# Patient Record
Sex: Female | Born: 1990
Health system: Southern US, Community
[De-identification: ages and names within clinical notes are randomized; demographics above are authoritative.]

## PROBLEM LIST (undated history)

## (undated) DIAGNOSIS — N926 Irregular menstruation, unspecified: Secondary | ICD-10-CM

## (undated) DIAGNOSIS — R87629 Unspecified abnormal cytological findings in specimens from vagina: Secondary | ICD-10-CM

## (undated) DIAGNOSIS — Z8742 Personal history of other diseases of the female genital tract: Secondary | ICD-10-CM

## (undated) DIAGNOSIS — I10 Essential (primary) hypertension: Secondary | ICD-10-CM

## (undated) HISTORY — DX: Irregular menstruation, unspecified: N92.6

## (undated) HISTORY — DX: Personal history of other diseases of the female genital tract: Z87.42

## (undated) HISTORY — DX: Unspecified abnormal cytological findings in specimens from vagina: R87.629

## (undated) HISTORY — PX: OTHER SURGICAL HISTORY: SHX169

---

## 2016-03-16 ENCOUNTER — Encounter: Payer: Self-pay | Admitting: Emergency Medicine

## 2016-03-16 ENCOUNTER — Emergency Department
Admission: EM | Admit: 2016-03-16 | Discharge: 2016-03-16 | Disposition: A | Discharge status: Home / Self Care | Payer: PRIVATE HEALTH INSURANCE | Attending: Emergency Medicine | Admitting: Emergency Medicine

## 2016-03-16 DIAGNOSIS — M79671 Pain in right foot: Secondary | ICD-10-CM | POA: Insufficient documentation

## 2016-03-16 NOTE — ED Provider Notes (Signed)
[facility] Emergency Department Provider Note  ____________________________________________   First MD Initiated Contact with Patient 03/16/16 [phone]     (approximate)  I have reviewed the triage vital signs and the nursing notes.   HISTORY  Chief Complaint Foot Pain    HPI [name] is a 25 y.o. female with no significant past medical history who presents for evaluation of gradual onset pain in her right heel over the last 2-3 days.  She reports that she has a new job that requires her to stand on her feet for 10-12 hours at a time.  The pain started after her new job began.  Weight-bearing and walking on the foot make it worse and is makes it a little bit better.  She denies any recent traumatic injuries.  She states that the pain is at the very back part of the right heel and does not radiate throughout the rest of her foot although pushing on other parts of her foot might make her heel hurt.  There are no breaks in the skin.   History reviewed. No pertinent past medical history.  There are no active problems to display for this patient.   History reviewed. No pertinent surgical history.  Prior to Admission medications   Not on File    Allergies Review of patient's allergies indicates no known allergies.  History reviewed. No pertinent family history.  Social History Social History  Substance Use Topics  . Smoking status: Never Smoker  . Smokeless tobacco: Never Used  . Alcohol use Yes     Comment: seldom    Review of Systems Constitutional: No fever/chills Cardiovascular: Denies chest pain. Respiratory: Denies shortness of breath. Gastrointestinal: No abdominal pain.  No nausea, no vomiting.  No diarrhea.  No constipation. Musculoskeletal: Pain in heel of right foot.  No history of trauma Skin: Negative for rash. Neurological: Negative for headaches, focal weakness or  numbness.   ____________________________________________   PHYSICAL EXAM:  VITAL SIGNS: ED Triage Vitals [03/16/16 0453]  Enc Vitals Group     BP (!) 139/97     Pulse Rate 65     Resp 18     Temp 98.9 F (37.2 C)     Temp Source Oral     SpO2 100 %     Weight 200 lb (90.7 kg)     Height 5\' 8"  (1.727 m)     Head Circumference      Peak Flow      Pain Score 7     Pain Loc      Pain Edu?      Excl. in GC?     Constitutional: Alert and oriented. Well appearing and in no acute distress. Head: Atraumatic. Cardiovascular: Normal rate, regular rhythm. Good peripheral circulation.  Respiratory: Normal respiratory effort.  No retractions.  Musculoskeletal: I do not appreciate any difference between the 2 feet and heels on visual inspection.  She has point tenderness to palpation of the posterior aspect of the calcaneus.  The bottom of the foot is not tender to palpation as I might expect with plantar fasciitis.  There is no midfoot tenderness.  There are no breaks in the skin or evidence of infection.  No lower extremity tenderness nor edema. No gross deformities of extremities. Neurologic:  Normal speech and language. No gross focal neurologic deficits are appreciated.  Skin:  Skin is warm, dry and intact. No rash noted. Psychiatric: Mood and affect are normal. Speech and behavior are  normal.  ____________________________________________   LABS (all labs ordered are listed, but only abnormal results are displayed)  Labs Reviewed - No data to display ____________________________________________  EKG  None - EKG not ordered by ED physician ____________________________________________  RADIOLOGY   No results found.  ____________________________________________   PROCEDURES  Procedure(s) performed:   Procedures   Critical Care performed: No ____________________________________________   INITIAL IMPRESSION / ASSESSMENT AND PLAN / ED COURSE  Pertinent labs &  imaging results that were available during my care of the patient were reviewed by me and considered in my medical decision making (see chart for details).  I believe the patient has some bruising to her calcaneus due to her new job requiring her to be on her feet for long periods of time.  There is no indication for x-rays.  I did offer her that if she felt strongly about getting one hour ordered but that absence of any traumatic injury to think that it would be unnecessary testing and she agrees that that is fine.  I will give her a hard soled shoe to see if the cells but I told her that it also may be that she needs a softer shoe with more cushioning.  I encouraged her to follow up with podiatry and see if orthotics might be helpful for her.  She has not taken the over-the-counter medications and I encouraged her to take NSAIDs as well as use cold packs.  She understands and agrees with the plan.   ____________________________________________  FINAL CLINICAL IMPRESSION(S) / ED DIAGNOSES  Final diagnoses:  Pain of right heel     MEDICATIONS GIVEN DURING THIS VISIT:  Medications - No data to display   NEW OUTPATIENT MEDICATIONS STARTED DURING THIS VISIT:  New Prescriptions   No medications on file    Modified Medications   No medications on file    Discontinued Medications   No medications on file     Note:  This document was prepared using Dragon voice recognition software and may include unintentional dictation errors.    Loleta Roseory Destyn Schuyler, MD 03/16/16 [phone]

## 2016-03-16 NOTE — Discharge Instructions (Signed)
Please consider following up with a podiatrist at the next available opportunity.  They may be able to help you with some custom inserts freer shoes or offer additional recommendations to help alleviate the discomfort that comes from standing on her feet for long periods of time.  Try using the shoe that we provided tonight as well as over-the-counter ibuprofen and/or Tylenol (although ibuprofen may be better for both the pain control and anti-inflammatory effect).

## 2016-03-16 NOTE — ED Triage Notes (Signed)
Pt c/o pain and swelling to right achilles; started Friday and worsening; denies injury; pt declined wheelchair upon arrival

## 2016-06-23 ENCOUNTER — Encounter: Payer: Self-pay | Admitting: Emergency Medicine

## 2016-06-23 ENCOUNTER — Emergency Department
Admission: EM | Admit: 2016-06-23 | Discharge: 2016-06-23 | Disposition: A | Discharge status: Home / Self Care | Payer: PRIVATE HEALTH INSURANCE | Attending: Emergency Medicine | Admitting: Emergency Medicine

## 2016-06-23 DIAGNOSIS — Z5321 Procedure and treatment not carried out due to patient leaving prior to being seen by health care provider: Secondary | ICD-10-CM | POA: Insufficient documentation

## 2016-06-23 DIAGNOSIS — N939 Abnormal uterine and vaginal bleeding, unspecified: Secondary | ICD-10-CM | POA: Insufficient documentation

## 2016-06-23 LAB — COMPREHENSIVE METABOLIC PANEL
ALT: 14 U/L (ref 14–54)
AST: 13 U/L — ABNORMAL LOW (ref 15–41)
Albumin: 4 g/dL (ref 3.5–5.0)
Alkaline Phosphatase: 32 U/L — ABNORMAL LOW (ref 38–126)
Anion gap: 4 — ABNORMAL LOW (ref 5–15)
BILIRUBIN TOTAL: 0.7 mg/dL (ref 0.3–1.2)
BUN: 9 mg/dL (ref 6–20)
CHLORIDE: 109 mmol/L (ref 101–111)
CO2: 25 mmol/L (ref 22–32)
Calcium: 9.1 mg/dL (ref 8.9–10.3)
Creatinine, Ser: 0.78 mg/dL (ref 0.44–1.00)
Glucose, Bld: 83 mg/dL (ref 65–99)
Potassium: 3.8 mmol/L (ref 3.5–5.1)
Sodium: 138 mmol/L (ref 135–145)
TOTAL PROTEIN: 6.8 g/dL (ref 6.5–8.1)

## 2016-06-23 LAB — CBC
HEMATOCRIT: 39.6 % (ref 35.0–47.0)
Hemoglobin: 13.3 g/dL (ref 12.0–16.0)
MCH: 31.1 pg (ref 26.0–34.0)
MCHC: 33.6 g/dL (ref 32.0–36.0)
MCV: 92.6 fL (ref 80.0–100.0)
Platelets: 284 10*3/uL (ref 150–440)
RBC: 4.27 MIL/uL (ref 3.80–5.20)
RDW: 14.2 % (ref 11.5–14.5)
WBC: 8.5 10*3/uL (ref 3.6–11.0)

## 2016-06-23 NOTE — ED Triage Notes (Signed)
Pt presents with vaginal bleeding and abd cramping for over a mth now. Pt is new to the are and does  Not have an GYN yet.

## 2016-10-14 ENCOUNTER — Encounter: Payer: Self-pay | Admitting: Emergency Medicine

## 2016-10-14 ENCOUNTER — Emergency Department
Admission: EM | Admit: 2016-10-14 | Discharge: 2016-10-14 | Disposition: A | Discharge status: Home / Self Care | Payer: PRIVATE HEALTH INSURANCE | Attending: Emergency Medicine | Admitting: Emergency Medicine

## 2016-10-14 DIAGNOSIS — L03011 Cellulitis of right finger: Secondary | ICD-10-CM | POA: Insufficient documentation

## 2016-10-14 MED ORDER — SULFAMETHOXAZOLE-TRIMETHOPRIM 800-160 MG PO TABS: 1.0000 | ORAL_TABLET | Freq: Two times a day (BID) | ORAL | 0 refills | Status: DC

## 2016-10-14 MED ORDER — LIDOCAINE HCL (PF) 1 % IJ SOLN
INTRAMUSCULAR | Status: AC
  Filled 2016-10-14: qty 5

## 2016-10-14 MED ORDER — TRAMADOL HCL 50 MG PO TABS: 50.0000 mg | ORAL_TABLET | Freq: Four times a day (QID) | ORAL | 0 refills | Status: DC | PRN

## 2016-10-14 NOTE — Discharge Instructions (Signed)
Advised [place] for 5 minutes twice a day for 3 days.

## 2016-10-14 NOTE — ED Provider Notes (Signed)
[facility] Emergency Department Provider Note   ____________________________________________   First MD Initiated Contact with Patient 10/14/16 1103     (approximate)  I have reviewed the triage vital signs and the nursing notes.   HISTORY  Chief Complaint Hand Pain    HPI [name] is a 26 y.o. female patient complaining of pain and edema third digit right hand. Patient state onset 2-3 days ago. Patient believes onset with the father her nails. Patient rates pain as 7/10. Patient had a pain as "achy". No palliative measures for this complaint.  History reviewed. No pertinent past medical history.  There are no active problems to display for this patient.   History reviewed. No pertinent surgical history.  Prior to Admission medications   Medication Sig Start Date End Date Taking? Authorizing Provider  sulfamethoxazole-trimethoprim (BACTRIM DS,SEPTRA DS) 800-160 MG tablet Take 1 tablet by mouth 2 (two) times daily. 10/14/16   Joni Reining, PA-C  traMADol (ULTRAM) 50 MG tablet Take 1 tablet (50 mg total) by mouth every 6 (six) hours as needed for moderate pain. 10/14/16   Joni Reining, PA-C    Allergies Patient has no known allergies.  No family history on file.  Social History Social History  Substance Use Topics  . Smoking status: Never Smoker  . Smokeless tobacco: Never Used  . Alcohol use Yes     Comment: seldom    Review of Systems  Constitutional: No fever/chills Eyes: No visual changes. ENT: No sore throat. Cardiovascular: Denies chest pain. Respiratory: Denies shortness of breath. Gastrointestinal: No abdominal pain.  No nausea, no vomiting.  No diarrhea.  No constipation. Genitourinary: Negative for dysuria. Musculoskeletal: Negative for back pain. Skin: Negative for rash Pain and edema right third finger Neurological: Negative for headaches, focal weakness or  numbness.   ____________________________________________   PHYSICAL EXAM:  VITAL SIGNS: ED Triage Vitals  Enc Vitals Group     BP 10/14/16 1035 134/90     Pulse Rate 10/14/16 1035 68     Resp 10/14/16 1035 18     Temp 10/14/16 1035 99.4 F (37.4 C)     Temp Source 10/14/16 1035 Oral     SpO2 10/14/16 1035 100 %     Weight 10/14/16 1035 200 lb (90.7 kg)     Height 10/14/16 1035 5\' 8"  (1.727 m)     Head Circumference --      Peak Flow --      Pain Score 10/14/16 0929 7     Pain Loc --      Pain Edu? --      Excl. in GC? --     Constitutional: Alert and oriented. Well appearing and in no acute distress. Eyes: Conjunctivae are normal. PERRL. EOMI. Head: Atraumatic. Nose: No congestion/rhinnorhea. Mouth/Throat: Mucous membranes are moist.  Oropharynx non-erythematous. Neck: No stridor.  No cervical spine tenderness to palpation. Hematological/Lymphatic/Immunilogical: No cervical lymphadenopathy. Cardiovascular: Normal rate, regular rhythm. Grossly normal heart sounds.  Good peripheral circulation. Respiratory: Normal respiratory effort.  No retractions. Lungs CTAB. Gastrointestinal: Soft and nontender. No distention. No abdominal bruits. No CVA tenderness. Musculoskeletal: No lower extremity tenderness nor edema.  No joint effusions. Neurologic:  Normal speech and language. No gross focal neurologic deficits are appreciated. No gait instability. Skin:  Skin is warm, dry and intact. No rash noted. Edema and erythema medial aspect third digit right hand Psychiatric: Mood and affect are normal. Speech and behavior are normal.  ____________________________________________   LABS (  all labs ordered are listed, but only abnormal results are displayed)  Labs Reviewed - No data to display ____________________________________________  EKG   ____________________________________________  RADIOLOGY   ____________________________________________   PROCEDURES  Procedure(s)  performed: INCISION AND DRAINAGE Performed by: Joni Reiningonald K Smith Consent: Verbal consent obtained. Risks and benefits: risks, benefits and alternatives were discussed Type: abscess  Body area: Third digit right hand  Anesthesia: Digital block  Incision was made with a scalpel.  Local anesthetic: lidocaine 1% without epinephrine  Anesthetic total: 4 ml  Complexity: Simple Blunt dissection to break up loculations  Drainage: purulent  Drainage amount: Small   Patient tolerance: Patient tolerated the procedure well with no immediate complications.     Procedures  Critical Care performed: No  ____________________________________________   INITIAL IMPRESSION / ASSESSMENT AND PLAN / ED COURSE  Pertinent labs & imaging results that were available during my care of the patient were reviewed by me and considered in my medical decision making (see chart for details).  Patient presented with nailbed edema to the third digit right hand. Patient given discharge care instructions. Patient given a prescription for Bactrim and tramadol. Patient advised follow-up with family doctor condition persists.      ____________________________________________   FINAL CLINICAL IMPRESSION(S) / ED DIAGNOSES  Final diagnoses:  Paronychia of finger of right hand      NEW MEDICATIONS STARTED DURING THIS VISIT:  New Prescriptions   SULFAMETHOXAZOLE-TRIMETHOPRIM (BACTRIM DS,SEPTRA DS) 800-160 MG TABLET    Take 1 tablet by mouth 2 (two) times daily.   TRAMADOL (ULTRAM) 50 MG TABLET    Take 1 tablet (50 mg total) by mouth every 6 (six) hours as needed for moderate pain.     Note:  This document was prepared using Dragon voice recognition software and may include unintentional dictation errors.    Joni ReiningSmith, Ronald K, PA-C 10/14/16 1137    Emily FilbertWilliams, Jonathan E, MD 10/14/16 [phone]

## 2016-10-14 NOTE — ED Triage Notes (Signed)
Right 4th finger swelling around nai,.

## 2017-04-06 ENCOUNTER — Ambulatory Visit (INDEPENDENT_AMBULATORY_CARE_PROVIDER_SITE_OTHER): Payer: BLUE CROSS/BLUE SHIELD | Admitting: Certified Nurse Midwife

## 2017-04-06 ENCOUNTER — Encounter: Payer: Self-pay | Admitting: Certified Nurse Midwife

## 2017-04-06 VITALS — BP 135/103 | HR 77 | Ht 68.0 in | Wt 205.5 lb

## 2017-04-06 DIAGNOSIS — Z124 Encounter for screening for malignant neoplasm of cervix: Secondary | ICD-10-CM | POA: Diagnosis not present

## 2017-04-06 DIAGNOSIS — Z113 Encounter for screening for infections with a predominantly sexual mode of transmission: Secondary | ICD-10-CM | POA: Diagnosis not present

## 2017-04-06 NOTE — Patient Instructions (Signed)
Preventive Care 18-39 Years, Female Preventive care refers to lifestyle choices and visits with your health care provider that can promote health and wellness. What does preventive care include?  A yearly physical exam. This is also called an annual well check.  Dental exams once or twice a year.  Routine eye exams. Ask your health care provider how often you should have your eyes checked.  Personal lifestyle choices, including: ? Daily care of your teeth and gums. ? Regular physical activity. ? Eating a healthy diet. ? Avoiding tobacco and drug use. ? Limiting alcohol use. ? Practicing safe sex. ? Taking vitamin and mineral supplements as recommended by your health care provider. What happens during an annual well check? The services and screenings done by your health care provider during your annual well check will depend on your age, overall health, lifestyle risk factors, and family history of disease. Counseling Your health care provider may ask you questions about your:  Alcohol use.  Tobacco use.  Drug use.  Emotional well-being.  Home and relationship well-being.  Sexual activity.  Eating habits.  Work and work Statistician.  Method of birth control.  Menstrual cycle.  Pregnancy history.  Screening You may have the following tests or measurements:  Height, weight, and BMI.  Diabetes screening. This is done by checking your blood sugar (glucose) after you have not eaten for a while (fasting).  Blood pressure.  Lipid and cholesterol levels. These may be checked every 5 years starting at age 66.  Skin check.  Hepatitis C blood test.  Hepatitis B blood test.  Sexually transmitted disease (STD) testing.  BRCA-related cancer screening. This may be done if you have a family history of breast, ovarian, tubal, or peritoneal cancers.  Pelvic exam and Pap test. This may be done every 3 years starting at age 40. Starting at age 59, this may be done every 5  years if you have a Pap test in combination with an HPV test.  Discuss your test results, treatment options, and if necessary, the need for more tests with your health care provider. Vaccines Your health care provider may recommend certain vaccines, such as:  Influenza vaccine. This is recommended every year.  Tetanus, diphtheria, and acellular pertussis (Tdap, Td) vaccine. You may need a Td booster every 10 years.  Varicella vaccine. You may need this if you have not been vaccinated.  HPV vaccine. If you are 69 or younger, you may need three doses over 6 months.  Measles, mumps, and rubella (MMR) vaccine. You may need at least one dose of MMR. You may also need a second dose.  Pneumococcal 13-valent conjugate (PCV13) vaccine. You may need this if you have certain conditions and were not previously vaccinated.  Pneumococcal polysaccharide (PPSV23) vaccine. You may need one or two doses if you smoke cigarettes or if you have certain conditions.  Meningococcal vaccine. One dose is recommended if you are age 27-21 years and a first-year college student living in a residence hall, or if you have one of several medical conditions. You may also need additional booster doses.  Hepatitis A vaccine. You may need this if you have certain conditions or if you travel or work in places where you may be exposed to hepatitis A.  Hepatitis B vaccine. You may need this if you have certain conditions or if you travel or work in places where you may be exposed to hepatitis B.  Haemophilus influenzae type b (Hib) vaccine. You may need this if  you have certain risk factors.  Talk to your health care provider about which screenings and vaccines you need and how often you need them. This information is not intended to replace advice given to you by your health care provider. Make sure you discuss any questions you have with your health care provider. Document Released: 07/21/2001 Document Revised: 02/12/2016  Document Reviewed: 03/26/2015 Elsevier Interactive Patient Education  2017 [employer].

## 2017-04-06 NOTE — Progress Notes (Signed)
GYNECOLOGY ANNUAL PREVENTATIVE CARE ENCOUNTER NOTE  Subjective:   [name] is a 26 y.o. G0P0000 female here for a routine annual gynecologic exam.  Current complaints: none.   Denies abnormal vaginal bleeding, discharge, pelvic pain, problems with intercourse or other gynecologic concerns. She uses condoms but not regularly and would like STD testing today. BP elevated today. Pt states that is abnormal for her BP usually fine . She denies history of hypertension and thinks it is because she is nervous about the exam.    Gynecologic History Patient's last menstrual period was 03/17/2017 (approximate). Contraception: condoms, not every time Last Pap: over a year ago. Results were: abnormal Last mammogram: N/A.   Obstetric History OB History  Gravida Para Term Preterm AB Living  0 0 0 0 0 0  SAB TAB Ectopic Multiple Live Births  0 0 0 0 0        Past Medical History:  Diagnosis Date  . Hx of abnormal cervical Pap smear   . Irregular periods    history    Past Surgical History:  Procedure Laterality Date  . colposcopy      No current outpatient prescriptions on file prior to visit.   No current facility-administered medications on file prior to visit.     No Known Allergies  Social History   Social History  . Marital status: Single    Spouse name: N/A  . Number of children: N/A  . Years of education: N/A   Occupational History  . Not on file.   Social History Main Topics  . Smoking status: Former Games developermoker  . Smokeless tobacco: Never Used  . Alcohol use Yes     Comment: seldom  . Drug use: No  . Sexual activity: Yes    Partners: Male    Birth control/ protection: Condom   Other Topics Concern  . Not on file   Social History Narrative  . No narrative on file    Family History  Problem Relation Age of Onset  . Hypertension Mother   . Stroke Mother        mini stroke?  Marland Kitchen. Thyroid disease Mother        thyroid removed  . Hypertension Sister      The following portions of the patient's history were reviewed and updated as appropriate: allergies, current medications, past family history, past medical history, past social history, past surgical history and problem list.  Review of Systems Constitutional: negative Eyes: negative Ears, nose, mouth, throat, and face: negative Respiratory: negative Cardiovascular: negative Gastrointestinal: negative Genitourinary:positive for abnormal odor Integument/breast: negative Hematologic/lymphatic: negative Musculoskeletal:negative Neurological: negative Behavioral/Psych: negative Endocrine: negative Allergic/Immunologic: negative   Objective:  BP (!) 135/103 (BP Location: Right Arm, Patient Position: Sitting, Cuff Size: Large)   Pulse 77   Ht 5\' 8"  (1.727 m)   Wt 205 lb 8 oz (93.2 kg)   LMP 03/17/2017 (Approximate) Comment: hx irregular periods  BMI 31.25 kg/m  CONSTITUTIONAL: Well-developed, well-nourished, overweight female in no acute distress.  HENT:  Normocephalic, atraumatic,. Oropharynx is clear and moist EYES: Conjunctivae and EOM are normal. No scleral icterus.  NECK: Normal range of motion, supple, no masses.  Normal thyroid.  SKIN: Skin is warm and dry. No rash noted. Not diaphoretic. No erythema. No pallor. NEUROLOGIC: Alert and oriented to person, place, and time. Normal reflexes, muscle tone coordination. No cranial nerve deficit noted. PSYCHIATRIC: Normal mood and affect. Normal behavior. Normal judgment and thought content. CARDIOVASCULAR: Normal heart rate noted,  regular rhythm RESPIRATORY: Clear to auscultation bilaterally. Effort and breath sounds normal, no problems with respiration noted. BREASTS: Symmetric in size. No masses, skin changes, nipple drainage, or lymphadenopathy. Fibrocystic breast bilaterally  ABDOMEN: Soft, normal bowel sounds, no distention noted.  No tenderness, rebound or guarding.  PELVIC: Normal appearing external genitalia; normal  appearing vaginal mucosa and cervix.  No abnormal discharge noted.  Pap smear obtained.  Normal uterine size, no other palpable masses, no uterine or adnexal tenderness. MUSCULOSKELETAL: Normal range of motion. No tenderness.  No cyanosis, clubbing, or edema.  2+ distal pulses.   Assessment and Plan:  1. Screening for cervical cancer - Pap IG w/ reflex to HPV when [facility] STD testing: Nuswab & blood work   Will follow up results of pap smear and manage accordingly. Primary care referral- for follow up of blood pressure. Pt is unsure when she had her last blood work. Release of medical records signed.  Contraceptive counseling and safe sex practices emphasized. PT states that she wants to get pregnant. Reviewed Menstrual cycle and ovulation  Routine preventative health maintenance measures emphasized. Please refer to After Visit Summary for other counseling recommendations.   Doreene Burke, CNM

## 2017-04-07 LAB — HIV ANTIBODY (ROUTINE TESTING W REFLEX): HIV Screen 4th Generation wRfx: NONREACTIVE

## 2017-04-07 LAB — RPR: RPR: NONREACTIVE

## 2017-04-07 LAB — HSV(HERPES SIMPLEX VRS) I + II AB-IGG
HSV 1 GLYCOPROTEIN G AB, IGG: 2.2 {index} — AB (ref 0.00–0.90)
HSV 2 IGG, TYPE SPEC: 6.76 {index} — AB (ref 0.00–0.90)

## 2017-04-07 LAB — HEPATITIS B SURFACE ANTIGEN: Hepatitis B Surface Ag: NEGATIVE

## 2017-04-08 LAB — PAP IG W/ RFLX HPV ASCU: PAP Smear Comment: 0

## 2017-04-09 ENCOUNTER — Other Ambulatory Visit: Payer: Self-pay | Admitting: Certified Nurse Midwife

## 2017-04-09 LAB — NUSWAB VAGINITIS PLUS (VG+)
ATOPOBIUM VAGINAE: HIGH {score} — AB
BVAB 2: HIGH Score — AB
CANDIDA ALBICANS, NAA: NEGATIVE
CANDIDA GLABRATA, NAA: NEGATIVE
Chlamydia trachomatis, NAA: NEGATIVE
MEGASPHAERA 1: HIGH {score} — AB
NEISSERIA GONORRHOEAE, NAA: NEGATIVE
Trich vag by NAA: NEGATIVE

## 2017-04-09 MED ORDER — METRONIDAZOLE 500 MG PO TABS: 500.0000 mg | ORAL_TABLET | Freq: Two times a day (BID) | ORAL | 0 refills | Status: AC

## 2017-04-09 NOTE — Progress Notes (Signed)
Pt called and discussed lab results. Nuswab positive for BV . Pap smear negative. Pt verbalizes understanding and answered all questions.   Doreene BurkeAnnie Shelbi Vaccaro, CNM

## 2017-07-05 ENCOUNTER — Encounter: Payer: Self-pay | Admitting: Certified Nurse Midwife

## 2017-07-05 ENCOUNTER — Encounter: Payer: Self-pay | Admitting: Nurse Practitioner

## 2017-07-05 ENCOUNTER — Ambulatory Visit (INDEPENDENT_AMBULATORY_CARE_PROVIDER_SITE_OTHER): Payer: BLUE CROSS/BLUE SHIELD | Admitting: Certified Nurse Midwife

## 2017-07-05 ENCOUNTER — Ambulatory Visit (INDEPENDENT_AMBULATORY_CARE_PROVIDER_SITE_OTHER): Payer: BLUE CROSS/BLUE SHIELD | Admitting: Nurse Practitioner

## 2017-07-05 ENCOUNTER — Other Ambulatory Visit: Payer: Self-pay

## 2017-07-05 ENCOUNTER — Ambulatory Visit (INDEPENDENT_AMBULATORY_CARE_PROVIDER_SITE_OTHER): Payer: BLUE CROSS/BLUE SHIELD

## 2017-07-05 VITALS — BP 130/90 | HR 81 | Temp 99.1°F | Ht 68.0 in | Wt 207.4 lb

## 2017-07-05 VITALS — BP 147/100 | HR 70 | Ht 68.0 in | Wt 208.4 lb

## 2017-07-05 DIAGNOSIS — N926 Irregular menstruation, unspecified: Secondary | ICD-10-CM | POA: Diagnosis not present

## 2017-07-05 DIAGNOSIS — Z87898 Personal history of other specified conditions: Secondary | ICD-10-CM

## 2017-07-05 DIAGNOSIS — Z8742 Personal history of other diseases of the female genital tract: Secondary | ICD-10-CM

## 2017-07-05 DIAGNOSIS — Z7689 Persons encountering health services in other specified circumstances: Secondary | ICD-10-CM | POA: Diagnosis not present

## 2017-07-05 DIAGNOSIS — I10 Essential (primary) hypertension: Secondary | ICD-10-CM

## 2017-07-05 MED ORDER — METRONIDAZOLE 500 MG PO TABS: 500.0000 mg | ORAL_TABLET | Freq: Two times a day (BID) | ORAL | 0 refills | Status: AC

## 2017-07-05 NOTE — Patient Instructions (Signed)
Polycystic Ovarian Syndrome °Polycystic ovarian syndrome (PCOS) is a common hormonal disorder among women of reproductive age. In most women with PCOS, many small fluid-filled sacs (cysts) grow on the ovaries, and the cysts are not part of a normal menstrual cycle. PCOS can cause problems with your menstrual periods and make it difficult to get pregnant. It can also cause an increased risk of miscarriage with pregnancy. If it is not treated, PCOS can lead to serious health problems, such as diabetes and heart disease. °What are the causes? °The cause of PCOS is not known, but it may be the result of a combination of certain factors, such as: °· Irregular menstrual cycle. °· High levels of certain hormones (androgens). °· Problems with the hormone that helps to control blood sugar (insulin resistance). °· Certain genes. ° °What increases the risk? °This condition is more likely to develop in women who have a family history of PCOS. °What are the signs or symptoms? °Symptoms of PCOS may include: °· Multiple ovarian cysts. °· Infrequent periods or no periods. °· Periods that are too frequent or too heavy. °· Unpredictable periods. °· Inability to get pregnant (infertility) because of not ovulating. °· Increased growth of hair on the face, chest, stomach, back, thumbs, thighs, or toes. °· Acne or oily skin. Acne may develop during adulthood, and it may not respond to treatment. °· Pelvic pain. °· Weight gain or obesity. °· Patches of thickened and dark brown or black skin on the neck, arms, breasts, or thighs (acanthosis nigricans). °· Excess hair growth on the face, chest, abdomen, or upper thighs (hirsutism). ° °How is this diagnosed? °This condition is diagnosed based on: °· Your medical history. °· A physical exam, including a pelvic exam. Your health care provider may look for areas of increased hair growth on your skin. °· Tests, such as: °? Ultrasound. This may be used to examine the ovaries and the lining of the  uterus (endometrium) for cysts. °? Blood tests. These may be used to check levels of sugar (glucose), female hormone (testosterone), and female hormones (estrogen and progesterone) in your blood. ° °How is this treated? °There is no cure for PCOS, but treatment can help to manage symptoms and prevent more health problems from developing. Treatment varies depending on: °· Your symptoms. °· Whether you want to have a baby or whether you need birth control (contraception). ° °Treatment may include nutrition and lifestyle changes along with: °· Progesterone hormone to start a menstrual period. °· Birth control pills to help you have regular menstrual periods. °· Medicines to make you ovulate, if you want to get pregnant. °· Medicine to reduce excessive hair growth. °· Surgery, in severe cases. This may involve making small holes in one or both of your ovaries. This decreases the amount of testosterone that your body produces. ° °Follow these instructions at home: °· Take over-the-counter and prescription medicines only as told by your health care provider. °· Follow a healthy meal plan. This can help you reduce the effects of PCOS. °? Eat a healthy diet that includes lean proteins, complex carbohydrates, fresh fruits and vegetables, low-fat dairy products, and healthy fats. Make sure to eat enough fiber. °· If you are overweight, lose weight as told by your health care provider. °? Losing 10% of your body weight may improve symptoms. °? Your health care provider can determine how much weight loss is best for you and can help you lose weight safely. °· Keep all follow-up visits as told by   your health care provider. This is important. °Contact a health care provider if: °· Your symptoms do not get better with medicine. °· You develop new symptoms. °This information is not intended to replace advice given to you by your health care provider. Make sure you discuss any questions you have with your health care  provider. °Document Released: 09/18/2004 Document Revised: 01/21/2016 Document Reviewed: 11/10/2015 °Elsevier Interactive Patient Education © 2018 Elsevier Inc. ° °

## 2017-07-05 NOTE — Progress Notes (Signed)
Subjective:    Patient ID: [name], female    DOB: 1990/07/14, 27 y.o.   MRN: [id number]  [name] is a 27 y.o. female presenting on 07/05/2017 for Establish Care (elevated bp )   HPI Establish Care New Provider Pt last seen by PCP many years ago (pediatrics), but has had regular care at [facility].  Obtain records from [facility].    Hypertension Elevated BP at [facility] today and at visits prior.  Was encouraged to seek care at a primary care office.  She [facility] with [facility] total. Family history of elevated BP in mother and sister.     - Pt denies headache, lightheadedness, dizziness, changes in vision, chest tightness/pressure, palpitations, leg swelling, sudden loss of speech or loss of consciousness. - She  reports no regular exercise routine and has recently moved into a more sedentary job.  She has since noted some weight gain - Her diet is high in salt, high in fat, and moderate to high in carbohydrates. Mon-Th: she has nabs (PB crackers), coffee for breakfast and Buffaloed blue salad for lunch.  No dinner, no snacks. Friday: [place] bacon, egg/cheese biscuit with fries, sweet tea or OJ once a week.  Buffaloed blue salad for lunch.  No dinner, no snacks. Saturday: meal out at [employer] chicken, creamy/artichoke/mushroom sauce, mashed potatoes, salad bar - Ate entire meal Sunday: meal cooked at home with baked chicken, vegetable side, bread  Anxiety Pt also admits she occasionally has chest tightness with sweaty palms. Symptoms last minutes and are related to stress in her new job.  These things did not occur prior to her new job.  Pt notes feeling a bit more anxious and nervous than in past, but notes she loves her new job.  Depression screen PHQ 2/9 07/05/2017  Decreased Interest 0  Down, Depressed, Hopeless 0  PHQ - 2 Score 0     Past Medical History:  Diagnosis Date  . Hx of abnormal cervical Pap smear    Pt with several  normal paps since abnormal pap  . Irregular periods    history   Past Surgical History:  Procedure Laterality Date  . colposcopy     Social History   Socioeconomic History  . Marital status: Single    Spouse name: Not on file  . Number of children: Not on file  . Years of education: Not on file  . Highest education level: Not on file  Social Needs  . Financial resource strain: Not on file  . Food insecurity - worry: Not on file  . Food insecurity - inability: Not on file  . Transportation needs - medical: Not on file  . Transportation needs - non-medical: Not on file  Occupational History  . Not on file  Tobacco Use  . Smoking status: Former Smoker    Packs/day: 0.50    Years: 7.00    Pack years: 3.50    Last attempt to quit: 07/05/2014    Years since quitting: 3.0  . Smokeless tobacco: Never Used  Substance and Sexual Activity  . Alcohol use: Yes    Comment: seldom  . Drug use: No  . Sexual activity: Yes    Partners: Male    Birth control/protection: None  Other Topics Concern  . Not on file  Social History Narrative  . Not on file   Family History  Problem Relation Age of Onset  . Hypertension Mother   . Stroke Mother  mini stroke?  Marland Kitchen Thyroid disease Mother        thyroid removed  . Hypertension Sister    No current outpatient medications on file prior to visit.   No current facility-administered medications on file prior to visit.     Review of Systems  Constitutional: Negative.   HENT: Negative.   Eyes: Negative.   Respiratory: Positive for chest tightness.   Cardiovascular: Negative.   Gastrointestinal: Negative.   Endocrine: Negative.   Genitourinary: Negative.   Musculoskeletal: Negative.   Skin: Negative.   Allergic/Immunologic: Negative.   Neurological: Negative.   Hematological: Negative.   Psychiatric/Behavioral: The patient is nervous/anxious.    Per HPI unless specifically indicated above     Objective:    BP (!) 146/97  (BP Location: Right Arm, Patient Position: Sitting, Cuff Size: Normal)   Pulse 81   Temp 99.1 F (37.3 C) (Oral)   Ht 5\' 8"  (1.727 m)   Wt 207 lb 6.4 oz (94.1 kg)   BMI 31.54 kg/m    BP Recheck 130/90  Wt Readings from Last 3 Encounters:  07/05/17 207 lb 6.4 oz (94.1 kg)  07/05/17 208 lb 6 oz (94.5 kg)  04/06/17 205 lb 8 oz (93.2 kg)    Physical Exam  General - overweight, well-appearing, NAD HEENT - Normocephalic, atraumatic Neck - supple, non-tender, no LAD, no thyromegaly Heart - RRR, no murmurs heard Lungs - Clear throughout all lobes, no wheezing, crackles, or rhonchi. Normal work of breathing. Extremeties - non-tender, no edema, cap refill < 2 seconds, peripheral pulses intact +2 bilaterally Skin - warm, dry Neuro - awake, alert, oriented x3, normal gait Psych - Normal mood, mildly anxious affect, normal behavior   Results for orders placed or performed in visit on 07/05/17  FSH/LH  Result Value Ref Range   LH 27.7 mIU/mL   FSH 7.8 mIU/mL  Lipid panel  Result Value Ref Range   Cholesterol, Total 163 100 - 199 mg/dL   Triglycerides 61 0 - 149 mg/dL   HDL 56 >16 mg/dL   VLDL Cholesterol Cal 12 5 - 40 mg/dL   LDL Calculated 95 0 - 99 mg/dL   Chol/HDL Ratio 2.9 0.0 - 4.4 ratio  Prolactin  Result Value Ref Range   Prolactin 9.6 4.8 - 23.3 ng/mL  Testosterone  Result Value Ref Range   Testosterone 36 8 - 48 ng/dL  TSH  Result Value Ref Range   TSH 1.700 0.450 - 4.500 uIU/mL  Hemoglobin A1C  Result Value Ref Range   Hgb A1c MFr Bld 5.2 4.8 - 5.6 %   Est. average glucose Bld gHb Est-mCnc 103 mg/dL      Assessment & Plan:   Problem List Items Addressed This Visit      Cardiovascular and Mediastinum   Essential hypertension - Primary Currently uncontrolled hypertension, but with recheck at BP at goal of < 130/80.  Pt is ready to start working on lifestyle modifications.  Taking medications tolerating well without side effects. No currently identified  complications except mild anxiety (situational).  Plan: 1. Delay starting medications until lifestyle modifications are attempted.  Discussed goals and treatment options with patient.  Encouraged stress management. 2. Consider labs at next appointment.  3. Encouraged heart healthy diet and increasing exercise to 30 minutes most days of the week. 4. Check BP 1-2 x per week at home, keep log, and bring to clinic at next appointment. 5. Follow up 6 weeks      Other  Other Visit Diagnoses    Encounter to establish care     Previous care received through OBGYN.  NO recent PCP.  [facility]. Past medical, family, and surgical history reviewed w/ pt.       Follow up plan: Return in about 6 weeks (around 08/16/2017) for blood pressure.  Wilhelmina McardleLauren Ilyse Tremain, DNP, AGPCNP-BC Adult Gerontology Primary Care Nurse Practitioner [facility] 08/01/2017, 3:47 PM

## 2017-07-05 NOTE — Patient Instructions (Addendum)
[name], Thank you for coming in to clinic today.  1. Check BP outside of the clinic 1x per week.    Goal BP < 130/80.  2. Reduce sodium intake to 2,300 mg per day.  START exercising for 30 minutes 2 days a week for 2 weeks.  Then, increase to 3 days per week for 2 weeks. Then, 4 days per week and continue.   Please schedule a follow-up appointment with [name], AGNP. Return in about 6 weeks (around 08/16/2017) for blood pressure.  If you have any other questions or concerns, please feel free to call the clinic or send a message through MyChart. You may also schedule an earlier appointment if necessary.  You will receive a survey after today's visit either digitally by e-mail or paper by [employer]. Your experiences and feedback matter to [place].  Please respond so we know how we are doing as we provide care for you.   [name], DNP, AGNP-BC Adult Gerontology Nurse Practitioner [facility]   DASH Eating Plan DASH stands for "Dietary Approaches to Stop Hypertension." The DASH eating plan is a healthy eating plan that has been shown to reduce high blood pressure (hypertension). It may also reduce your risk for type 2 diabetes, heart disease, and stroke. The DASH eating plan may also help with weight loss. What are tips for following this plan? General guidelines  Avoid eating more than 2,300 mg (milligrams) of salt (sodium) a day. If you have hypertension, you may need to reduce your sodium intake to 1,500 mg a day.  Limit alcohol intake to no more than 1 drink a day for nonpregnant women and 2 drinks a day for men. One drink equals 12 oz of beer, 5 oz of wine, or 1 oz of hard liquor.  Work with your health care provider to maintain a healthy body weight or to lose weight. Ask what an ideal weight is for you.  Get at least 30 minutes of exercise that causes your heart to beat faster (aerobic exercise) most days of the week. Activities may include walking,  swimming, or biking.  Work with your health care provider or diet and nutrition specialist (dietitian) to adjust your eating plan to your individual calorie needs. Reading food labels  Check food labels for the amount of sodium per serving. Choose foods with less than 5 percent of the Daily Value of sodium. Generally, foods with less than 300 mg of sodium per serving fit into this eating plan.  To find whole grains, look for the word "whole" as the first word in the ingredient list. Shopping  Buy products labeled as "low-sodium" or "no salt added."  Buy fresh foods. Avoid canned foods and premade or frozen meals. Cooking  Avoid adding salt when cooking. Use salt-free seasonings or herbs instead of table salt or sea salt. Check with your health care provider or pharmacist before using salt substitutes.  Do not fry foods. Cook foods using healthy methods such as baking, boiling, grilling, and broiling instead.  Cook with heart-healthy oils, such as olive, canola, soybean, or sunflower oil. Meal planning   Eat a balanced diet that includes: ? 5 or more servings of fruits and vegetables each day. At each meal, try to fill half of your plate with fruits and vegetables. ? Up to 6-8 servings of whole grains each day. ? Less than 6 oz of lean meat, poultry, or fish each day. A 3-oz serving of meat is about the  same size as a deck of cards. One egg equals 1 oz. ? 2 servings of low-fat dairy each day. ? A serving of nuts, seeds, or beans 5 times each week. ? Heart-healthy fats. Healthy fats called Omega-3 fatty acids are found in foods such as flaxseeds and coldwater fish, like sardines, salmon, and mackerel.  Limit how much you eat of the following: ? Canned or prepackaged foods. ? Food that is high in trans fat, such as fried foods. ? Food that is high in saturated fat, such as fatty meat. ? Sweets, desserts, sugary drinks, and other foods with added sugar. ? Full-fat dairy  products.  Do not salt foods before eating.  Try to eat at least 2 vegetarian meals each week.  Eat more home-cooked food and less restaurant, buffet, and fast food.  When eating at a restaurant, ask that your food be prepared with less salt or no salt, if possible. What foods are recommended? The items listed may not be a complete list. Talk with your dietitian about what dietary choices are best for you. Grains Whole-grain or whole-wheat bread. Whole-grain or whole-wheat pasta. Brown rice. Orpah Cobb. Bulgur. Whole-grain and low-sodium cereals. Pita bread. Low-fat, low-sodium crackers. Whole-wheat flour tortillas. Vegetables Fresh or frozen vegetables (raw, steamed, roasted, or grilled). Low-sodium or reduced-sodium tomato and vegetable juice. Low-sodium or reduced-sodium tomato sauce and tomato paste. Low-sodium or reduced-sodium canned vegetables. Fruits All fresh, dried, or frozen fruit. Canned fruit in natural juice (without added sugar). Meat and other protein foods Skinless chicken or [place]. Ground chicken or [place]. Pork with fat trimmed off. Fish and seafood. Egg whites. Dried beans, peas, or lentils. Unsalted nuts, nut butters, and seeds. Unsalted canned beans. Lean cuts of beef with fat trimmed off. Low-sodium, lean deli meat. Dairy Low-fat (1%) or fat-free (skim) milk. Fat-free, low-fat, or reduced-fat cheeses. Nonfat, low-sodium ricotta or cottage cheese. Low-fat or nonfat yogurt. Low-fat, low-sodium cheese. Fats and oils Soft margarine without trans fats. Vegetable oil. Low-fat, reduced-fat, or light mayonnaise and salad dressings (reduced-sodium). Canola, safflower, olive, soybean, and sunflower oils. Avocado. Seasoning and other foods Herbs. Spices. Seasoning mixes without salt. Unsalted popcorn and pretzels. Fat-free sweets. What foods are not recommended? The items listed may not be a complete list. Talk with your dietitian about what dietary choices are best for  you. Grains Baked goods made with fat, such as croissants, muffins, or some breads. Dry pasta or rice meal packs. Vegetables Creamed or fried vegetables. Vegetables in a cheese sauce. Regular canned vegetables (not low-sodium or reduced-sodium). Regular canned tomato sauce and paste (not low-sodium or reduced-sodium). Regular tomato and vegetable juice (not low-sodium or reduced-sodium). Rosita Fire. Olives. Fruits Canned fruit in a light or heavy syrup. Fried fruit. Fruit in cream or butter sauce. Meat and other protein foods Fatty cuts of meat. Ribs. Fried meat. Tomasa Blase. Sausage. Bologna and other processed lunch meats. Salami. Fatback. Hotdogs. Bratwurst. Salted nuts and seeds. Canned beans with added salt. Canned or smoked fish. Whole eggs or egg yolks. Chicken or [place] with skin. Dairy Whole or 2% milk, cream, and half-and-half. Whole or full-fat cream cheese. Whole-fat or sweetened yogurt. Full-fat cheese. Nondairy creamers. Whipped toppings. Processed cheese and cheese spreads. Fats and oils Butter. Stick margarine. Lard. Shortening. Ghee. Bacon fat. Tropical oils, such as coconut, palm kernel, or palm oil. Seasoning and other foods Salted popcorn and pretzels. Onion salt, garlic salt, seasoned salt, table salt, and sea salt. Worcestershire sauce. Tartar sauce. Barbecue sauce. Teriyaki sauce. Soy sauce, including  reduced-sodium. Steak sauce. Canned and packaged gravies. Fish sauce. Oyster sauce. Cocktail sauce. Horseradish that you find on the shelf. Ketchup. Mustard. Meat flavorings and tenderizers. Bouillon cubes. Hot sauce and Tabasco sauce. Premade or packaged marinades. Premade or packaged taco seasonings. Relishes. Regular salad dressings. Where to find more information:  National Heart, Lung, and Blood Institute: PopSte[employer].iswww.nhlbi.nih.gov  American Heart Association: www.heart.org Summary  The DASH eating plan is a healthy eating plan that has been shown to reduce high blood pressure  (hypertension). It may also reduce your risk for type 2 diabetes, heart disease, and stroke.  With the DASH eating plan, you should limit salt (sodium) intake to 2,300 mg a day. If you have hypertension, you may need to reduce your sodium intake to 1,500 mg a day.  When on the DASH eating plan, aim to eat more fresh fruits and vegetables, whole grains, lean proteins, low-fat dairy, and heart-healthy fats.  Work with your health care provider or diet and nutrition specialist (dietitian) to adjust your eating plan to your individual calorie needs. This information is not intended to replace advice given to you by your health care provider. Make sure you discuss any questions you have with your health care provider. Document Released: 05/14/2011 Document Revised: 05/18/2016 Document Reviewed: 05/18/2016 Elsevier Interactive Patient Education  [employer]2018 Elsevier Inc.

## 2017-07-05 NOTE — Progress Notes (Addendum)
GYN ENCOUNTER NOTE  Subjective:       [name] is a 27 y.o. G0P0000 female is here for gynecologic evaluation of the following issues:  1. Irregular menstrual cycles.  She has not had a period since November. She states that she has a history of irregular cycles and started using birth control for this reason. She was on birth control from 2016-2018. She had been regular up until November. She denies having had any work up for this in the past. Negative pregnancy test today in the office. She also complains of increased vaginal discharge with odor. She has a history of BV and feels like she has a reoccurrence.    Gynecologic History Patient's last menstrual period was 04/20/2017 (exact date). Contraception: none Last Pap: 04/06/2017. Results were: normal Last mammogram: n/a.    Obstetric History OB History  Gravida Para Term Preterm AB Living  0 0 0 0 0 0  SAB TAB Ectopic Multiple Live Births  0 0 0 0 0        Past Medical History:  Diagnosis Date  . Hx of abnormal cervical Pap smear   . Irregular periods    history    Past Surgical History:  Procedure Laterality Date  . colposcopy      No current outpatient medications on file prior to visit.   No current facility-administered medications on file prior to visit.     No Known Allergies  Social History   Socioeconomic History  . Marital status: Single    Spouse name: Not on file  . Number of children: Not on file  . Years of education: Not on file  . Highest education level: Not on file  Social Needs  . Financial resource strain: Not on file  . Food insecurity - worry: Not on file  . Food insecurity - inability: Not on file  . Transportation needs - medical: Not on file  . Transportation needs - non-medical: Not on file  Occupational History  . Not on file  Tobacco Use  . Smoking status: Former Games developermoker  . Smokeless tobacco: Never Used  Substance and Sexual Activity  . Alcohol use: Yes    Comment:  seldom  . Drug use: No  . Sexual activity: Yes    Partners: Male    Birth control/protection: None  Other Topics Concern  . Not on file  Social History Narrative  . Not on file    Family History  Problem Relation Age of Onset  . Hypertension Mother   . Stroke Mother        mini stroke?  Marland Kitchen. Thyroid disease Mother        thyroid removed  . Hypertension Sister     The following portions of the patient's history were reviewed and updated as appropriate: allergies, current medications, past family history, past medical history, past social history, past surgical history and problem list.   Review of Systems Review of Systems - Negative except  as metioned in HPI Review of Systems - General ROS: negative for - chills, fatigue, fever, hot flashes, malaise or night sweats Hematological and Lymphatic ROS: negative for - bleeding problems or swollen lymph nodes Gastrointestinal ROS: negative for - abdominal pain, blood in stools, change in bowel habits and nausea/vomiting Musculoskeletal ROS: negative for - joint pain, muscle pain or muscular weakness Genito-Urinary ROS: negative for - change in menstrual cycle, dysmenorrhea, dyspareunia, dysuria, genital discharge, genital ulcers, hematuria, incontinence, irregular/heavy menses, nocturia or pelvic pain  Objective:  BP (!) 147/100   Pulse 70   Ht 5\' 8"  (1.727 m)   Wt 208 lb 6 oz (94.5 kg)   LMP 04/20/2017 (Exact Date)   BMI 31.68 kg/m  CONSTITUTIONAL: Well-developed, well-nourished female in no acute distress.  HENT:  Normocephalic, atraumatic.  NECK: Normal range of motion, supple, no masses.  Normal thyroid.  Acanthas nigricans of neck   SKIN: Skin is warm and dry. No rash noted. Not diaphoretic. No erythema. No pallor. Hirsutism- face  NEUROLGIC: Alert and oriented to person, place, and time.  PSYCHIATRIC: Normal mood and affect. Normal behavior. Normal judgment and thought content. CARDIOVASCULAR:Not Examined RESPIRATORY:  clear , no signs of respiratory  distress BREASTS: Not Examined ABDOMEN: Soft, non distended; Non tender.  No Organomegaly. PELVIC:not indicated MUSCULOSKELETAL: Normal range of motion. No tenderness.  No cyanosis, clubbing, or edema.   Assessment:   1. Irregular menstruation - FSH/LH - Lipid panel - Prolactin - Testosterone - TSH - Hemoglobin A1C - US PELVIS (TRANSABDOMINAL ONLY); Future     Plan:   Discussed elevated BP today. Pt states that she received a referral for primary care from [place] but has not gone yet. Reviewed risks of elevated BP strongly encouraged to follow up with primary care. She agrees. Flagyl ordered. Will follow up with results of labs and ultrasound .   Doreene Burke , CNM   I attest more than 50% of visit spent reviewing symptoms, discussing history, and developing plan of care. Face to face time 15 min.   Doreene Burke, CNM

## 2017-07-05 NOTE — Addendum Note (Signed)
Addended by: Mechele ClaudeHOMPSON, Jentri Aye M on: 07/05/2017 11:39 AM   Modules accepted: Orders

## 2017-07-05 NOTE — Progress Notes (Signed)
Pt is here for irreg periods. LMP 04/20/17. Is not using bc.

## 2017-07-06 ENCOUNTER — Encounter: Payer: Self-pay | Admitting: Certified Nurse Midwife

## 2017-07-06 LAB — LIPID PANEL
Chol/HDL Ratio: 2.9 ratio (ref 0.0–4.4)
Cholesterol, Total: 163 mg/dL (ref 100–199)
HDL: 56 mg/dL (ref >39)
LDL Calculated: 95 mg/dL (ref 0–99)
TRIGLYCERIDES: 61 mg/dL (ref 0–149)
VLDL Cholesterol Cal: 12 mg/dL (ref 5–40)

## 2017-07-06 LAB — TESTOSTERONE: Testosterone: 36 ng/dL (ref 8–48)

## 2017-07-06 LAB — HEMOGLOBIN A1C
ESTIMATED AVERAGE GLUCOSE: 103 mg/dL
HEMOGLOBIN A1C: 5.2 % (ref 4.8–5.6)

## 2017-07-06 LAB — FSH/LH
FSH: 7.8 m[IU]/mL
LH: 27.7 m[IU]/mL

## 2017-07-06 LAB — PROLACTIN: PROLACTIN: 9.6 ng/mL (ref 4.8–23.3)

## 2017-07-06 LAB — TSH: TSH: 1.7 u[IU]/mL (ref 0.450–4.500)

## 2017-07-21 ENCOUNTER — Telehealth: Payer: Self-pay

## 2017-07-21 NOTE — Telephone Encounter (Signed)
Spoke with pt- she will read the email and call us back if she has any questions or would like to schedule a followup appointment.

## 2017-08-01 ENCOUNTER — Encounter: Payer: Self-pay | Admitting: Nurse Practitioner

## 2017-08-01 DIAGNOSIS — Z8742 Personal history of other diseases of the female genital tract: Secondary | ICD-10-CM | POA: Insufficient documentation

## 2017-08-17 ENCOUNTER — Other Ambulatory Visit: Payer: Self-pay

## 2017-08-17 ENCOUNTER — Encounter: Payer: Self-pay | Admitting: Nurse Practitioner

## 2017-08-17 ENCOUNTER — Ambulatory Visit: Payer: BLUE CROSS/BLUE SHIELD | Admitting: Nurse Practitioner

## 2017-08-17 VITALS — BP 142/94 | HR 76 | Temp 98.7°F | Ht 68.0 in | Wt 210.4 lb

## 2017-08-17 DIAGNOSIS — Z9189 Other specified personal risk factors, not elsewhere classified: Secondary | ICD-10-CM | POA: Diagnosis not present

## 2017-08-17 DIAGNOSIS — I10 Essential (primary) hypertension: Secondary | ICD-10-CM

## 2017-08-17 DIAGNOSIS — R400 Somnolence: Secondary | ICD-10-CM | POA: Diagnosis not present

## 2017-08-17 MED ORDER — HYDROCHLOROTHIAZIDE 12.5 MG PO TABS: 12.5000 mg | ORAL_TABLET | Freq: Every day | ORAL | 3 refills | Status: DC

## 2017-08-17 NOTE — Progress Notes (Signed)
Subjective:    Patient ID: [name], female    DOB: 1990/07/02, 27 y.o.   MRN: [id number]  [name] is a 27 y.o. female presenting on 08/17/2017 for Hypertension   HPI Hypertension  - She is not checking BP at home or outside of clinic. Single reading was 130s/84 - Current medications: none,  - She is not currently symptomatic. - Pt denies headache, lightheadedness, dizziness, changes in vision, chest tightness/pressure, palpitations, leg swelling, sudden loss of speech or loss of consciousness. - She  reports no regular exercise routine.  Sometimes 1 x / week to gym with treadmill 30 mins, 2 mins stair climber.  Stationary rope x 1 min, 3 sets box jumps. - Her diet is moderate in salt, moderate in fat, and moderate in carbohydrates.  Daytime Sleepiness Daytime sleepiness, early am headache with resolution in about 30 minutes.  Only occasionally takes ibuprofen.  Often feels she needs to sleep for 8-10 hours and still wakes without feeling rested.  STOP-Bang OSA (scoring y/n) Snoring y   Tiredness y   Observed apneas n   Pressure HTN y   BMI > 35 kg/m2 n   Age > 58  n   Neck (female >17 in; Female >16 in)  n = 16.0 in  Gender female n   OSA risk low (0-2)  OSA risk intermediate (3-4)  OSA risk high (5+)  Total: 3    Epworth Sleepiness Scale Patient's Answer Chance of dozing off under normal circumstances  Sitting and reading  0   Watching TV 3 0 = Never  Sitting inactive in a public place 1 1 = Slight chance  As a passenger in a car for an hour without a break 3 2 = Moderate chance  Lying down to rest in the afternoon when circumstances permit 3 3 = High chance  Sitting and talking to someone 1   Sitting quietly after a lunch without alcohol 3   In a car, while stopped for a few minutes in traffic 0                                                              Total Score: 14    0-7: It is unlikely that you are abnormally sleepy. 8-9: You have an average amount  of daytime sleepiness. >9: POSITIVE - Recommend further evaluation, sleep specialist or sleep study (>16-24 = severe)   Social History   Tobacco Use  . Smoking status: Former Smoker    Packs/day: 0.50    Years: 7.00    Pack years: 3.50    Last attempt to quit: 07/05/2014    Years since quitting: 3.1  . Smokeless tobacco: Never Used  Substance Use Topics  . Alcohol use: Yes    Comment: seldom  . Drug use: No    Review of Systems Per HPI unless specifically indicated above     Objective:    BP (!) 142/94 (BP Location: Right Arm, Patient Position: Sitting, Cuff Size: Normal)   Pulse 76   Temp 98.7 F (37.1 C) (Oral)   Ht 5\' 8"  (1.727 m)   Wt 210 lb 6.4 oz (95.4 kg)   BMI 31.99 kg/m   BP recheck consistent with first reading on intake. Wt Readings from Last 3  Encounters:  08/17/17 210 lb 6.4 oz (95.4 kg)  07/05/17 207 lb 6.4 oz (94.1 kg)  07/05/17 208 lb 6 oz (94.5 kg)    Physical Exam  General - Obese, well-appearing, NAD HEENT - Normocephalic, atraumatic Neck - supple, non-tender, no LAD, no thyromegaly Heart - RRR, no murmurs heard Lungs - Clear throughout all lobes, no wheezing, crackles, or rhonchi. Normal work of breathing. Extremeties - non-tender, no edema, cap refill < 2 seconds, peripheral pulses intact +2 bilaterally Skin - warm, dry Neuro - awake, alert, oriented x3, normal gait Psych - Normal mood and affect, normal behavior   Results for orders placed or performed in visit on 07/05/17  FSH/LH  Result Value Ref Range   LH 27.7 mIU/mL   FSH 7.8 mIU/mL  Lipid panel  Result Value Ref Range   Cholesterol, Total 163 100 - 199 mg/dL   Triglycerides 61 0 - 149 mg/dL   HDL 56 >16 mg/dL   VLDL Cholesterol Cal 12 5 - 40 mg/dL   LDL Calculated 95 0 - 99 mg/dL   Chol/HDL Ratio 2.9 0.0 - 4.4 ratio  Prolactin  Result Value Ref Range   Prolactin 9.6 4.8 - 23.3 ng/mL  Testosterone  Result Value Ref Range   Testosterone 36 8 - 48 ng/dL  TSH  Result Value  Ref Range   TSH 1.700 0.450 - 4.500 uIU/mL  Hemoglobin A1C  Result Value Ref Range   Hgb A1c MFr Bld 5.2 4.8 - 5.6 %   Est. average glucose Bld gHb Est-mCnc 103 mg/dL      Assessment & Plan:   Problem List Items Addressed This Visit      Cardiovascular and Mediastinum   Essential hypertension - Primary    Persistently uncontrolled hypertension.  BP goal < 130/80.  Pt is not currently working on lifestyle modifications.  Deferred starting medications at last visit since was a new diagnosis, but lifestyle modifications have not been consistent enough to bring BP into goal range. Taking medications tolerating well without side effects.  - No currently identified complications.  However, patient with significant daytime sleepiness and risk factors for OSA which could worsen hypertension control.  Plan: 1. START taking hydrochlorothiazide 12.5 mg once daily. 2. Obtain labs CMP.  3. Encouraged heart healthy diet and increasing exercise to 30 minutes most days of the week. 4. Check BP 1-2 x per week at home, keep log, and bring to clinic at next appointment. 5. Follow up 6 weeks.        Relevant Medications   hydrochlorothiazide (HYDRODIURIL) 12.5 MG tablet   Other Relevant Orders   COMPLETE METABOLIC PANEL WITH GFR   Ambulatory referral to Sleep Studies    Other Visit Diagnoses    Daytime sleepiness       Relevant Orders   Ambulatory referral to Sleep Studies   Risk factors for obstructive sleep apnea       Relevant Orders   Ambulatory referral to Sleep Studies    Sleep apnea is suggested with positive screenings with STOPbang and Epworth sleepiness scores.  Neck circumference is equal to 16" which places her at highest end of normal range for female.  Possible contributing factor to hypertension.  Plan: 1.  Encouraged good sleep hygiene for excessive daytime sleepiness. 2. Referral to sleep study at [facility].  Prefer PSG in clinic for improved validity, but can  have in home if insurance coverage dictates. 3. Followup as needed.  Meds ordered this encounter  Medications  . hydrochlorothiazide (HYDRODIURIL) 12.5 MG tablet    Sig: Take 1 tablet (12.5 mg total) by mouth daily.    Dispense:  90 tablet    Refill:  3    Order Specific Question:   Supervising Provider    Answer:   Smitty CordsKARAMALEGOS, ALEXANDER J [[id number]]      Follow up plan: Return in about 6 weeks (around 09/28/2017) for hypertension.  Laura McardleLauren Kambrea Carrasco, DNP, AGPCNP-BC Adult Gerontology Primary Care Nurse Practitioner [facility] 08/25/2017, 2:56 PM

## 2017-08-17 NOTE — Patient Instructions (Addendum)
[name], Thank you for coming in to clinic today.  1. START taking hydrochlorothiazide 12.5 mg tablet once daily in am.  2. Continue healthy lifestyle and eating.  3. Sleep study: [facility] will call you and arrange your sleep study.  Please schedule a follow-up appointment with Wilhelmina McardleLauren Tieara Flitton, AGNP. Return in about 6 weeks (around 09/28/2017) for hypertension.  If you have any other questions or concerns, please feel free to call the clinic or send a message through MyChart. You may also schedule an earlier appointment if necessary.  You will receive a survey after today's visit either digitally by e-mail or paper by [employer] mail. Your experiences and feedback matter to us.  Please respond so we know how we are doing as we provide care for you.   Wilhelmina McardleLauren Brilee Port, DNP, AGNP-BC Adult Gerontology Nurse Practitioner [facility]

## 2017-08-18 ENCOUNTER — Ambulatory Visit: Payer: BLUE CROSS/BLUE SHIELD | Admitting: Nurse Practitioner

## 2017-08-25 ENCOUNTER — Encounter: Payer: Self-pay | Admitting: Nurse Practitioner

## 2017-08-25 NOTE — Assessment & Plan Note (Signed)
Persistently uncontrolled hypertension.  BP goal < 130/80.  Pt is not currently working on lifestyle modifications.  Deferred starting medications at last visit since was a new diagnosis, but lifestyle modifications have not been consistent enough to bring BP into goal range. Taking medications tolerating well without side effects.  - No currently identified complications.  However, patient with significant daytime sleepiness and risk factors for OSA which could worsen hypertension control.  Plan: 1. START taking hydrochlorothiazide 12.5 mg once daily. 2. Obtain labs CMP.  3. Encouraged heart healthy diet and increasing exercise to 30 minutes most days of the week. 4. Check BP 1-2 x per week at home, keep log, and bring to clinic at next appointment. 5. Follow up 6 weeks.

## 2017-08-31 DIAGNOSIS — G4730 Sleep apnea, unspecified: Secondary | ICD-10-CM | POA: Diagnosis not present

## 2017-09-06 ENCOUNTER — Encounter: Payer: Self-pay | Admitting: Nurse Practitioner

## 2017-09-27 ENCOUNTER — Ambulatory Visit: Payer: BLUE CROSS/BLUE SHIELD | Admitting: Nurse Practitioner

## 2017-09-28 ENCOUNTER — Ambulatory Visit: Payer: BLUE CROSS/BLUE SHIELD | Admitting: Nurse Practitioner

## 2017-11-09 ENCOUNTER — Other Ambulatory Visit: Payer: Self-pay

## 2017-11-09 ENCOUNTER — Encounter (HOSPITAL_COMMUNITY): Payer: Self-pay | Admitting: Emergency Medicine

## 2017-11-09 ENCOUNTER — Telehealth: Payer: Self-pay | Admitting: *Deleted

## 2017-11-09 ENCOUNTER — Emergency Department (HOSPITAL_COMMUNITY)
Admission: EM | Admit: 2017-11-09 | Discharge: 2017-11-09 | Disposition: A | Discharge status: Home / Self Care | Payer: BLUE CROSS/BLUE SHIELD | Attending: Emergency Medicine | Admitting: Emergency Medicine

## 2017-11-09 DIAGNOSIS — Z87891 Personal history of nicotine dependence: Secondary | ICD-10-CM | POA: Diagnosis not present

## 2017-11-09 DIAGNOSIS — I10 Essential (primary) hypertension: Secondary | ICD-10-CM | POA: Insufficient documentation

## 2017-11-09 DIAGNOSIS — L03116 Cellulitis of left lower limb: Secondary | ICD-10-CM | POA: Diagnosis not present

## 2017-11-09 DIAGNOSIS — Z79899 Other long term (current) drug therapy: Secondary | ICD-10-CM | POA: Diagnosis not present

## 2017-11-09 HISTORY — DX: Essential (primary) hypertension: I10

## 2017-11-09 MED ORDER — CLINDAMYCIN HCL 150 MG PO CAPS: 300.0000 mg | ORAL_CAPSULE | Freq: Four times a day (QID) | ORAL | 0 refills | Status: AC

## 2017-11-09 NOTE — Telephone Encounter (Signed)
Pharmacy called related to Rx: clindamycin # .Marland Kitchen.Marland Kitchen.EDCM clarified with EDP (Mortis) to change Rx to: 300 mg capusule take 1 capsule TID x 7 days #21.

## 2017-11-09 NOTE — Discharge Instructions (Addendum)
You may ice area and use NSAIDs (ibuprofen or naproxen) for additional relief.

## 2017-11-09 NOTE — ED Provider Notes (Signed)
[facility] Provider Note  CSN: [id number] Arrival date & time: 11/09/17  0805  History   Chief Complaint Chief Complaint  Patient presents with  . Insect Bite    HPI [name] is a 27 y.o. female with a medical history of HTN who presented to the ED for an insect bite on her left inner thigh that occurred over the weekend. She is unsure what she was bit by, but states the area has been red and tender for the last couple of days. She denies any drainage or bleeding from bite area. She states the area is painful when her legs rub together. Denies recent travel or being in wooded areas. Denies other rashes or bites, fever, fatigue, chills, paresthesias, weakness or other neuro deficits. Denies arthralgias, gait issues, myalgias, chest pain or palpitations. She has tried witch hazel which provided some relief.  Past Medical History:  Diagnosis Date  . Hx of abnormal cervical Pap smear    Pt with several normal paps since abnormal pap  . Hypertension   . Irregular periods    history    Patient Active Problem List   Diagnosis Date Noted  . Hx of abnormal cervical Pap smear   . Essential hypertension 07/05/2017    Past Surgical History:  Procedure Laterality Date  . colposcopy       OB History    Gravida  0   Para  0   Term  0   Preterm  0   AB  0   Living  0     SAB  0   TAB  0   Ectopic  0   Multiple  0   Live Births  0            Home Medications    Prior to Admission medications   Medication Sig Start Date End Date Taking? Authorizing Provider  clindamycin (CLEOCIN) 150 MG capsule Take 2 capsules (300 mg total) by mouth 4 (four) times daily for 7 days. 11/09/17 11/16/17  Mortis, Jerrel IvoryGabrielle I, PA-C  hydrochlorothiazide (HYDRODIURIL) 12.5 MG tablet Take 1 tablet (12.5 mg total) by mouth daily. 08/17/17   Galen ManilaKennedy, Lauren Renee, NP    Family History Family History  Problem Relation Age of Onset  .  Hypertension Mother   . Stroke Mother        mini stroke?  Marland Kitchen. Thyroid disease Mother        thyroid removed  . Hypertension Sister     Social History Social History   Tobacco Use  . Smoking status: Former Smoker    Packs/day: 0.50    Years: 7.00    Pack years: 3.50    Last attempt to quit: 07/05/2014    Years since quitting: 3.3  . Smokeless tobacco: Never Used  Substance Use Topics  . Alcohol use: Yes    Comment: seldom  . Drug use: No     Allergies   Patient has no known allergies.   Review of Systems Review of Systems  Constitutional: Negative for chills, fatigue and fever.  Endocrine: Negative.   Musculoskeletal: Negative for arthralgias, gait problem, joint swelling and myalgias.  Skin: Positive for color change and wound.  Allergic/Immunologic: Negative.   Neurological: Negative.      Physical Exam Updated Vital Signs BP 128/80 (BP Location: Right Arm)   Pulse 64   Temp 98.3 F (36.8 C) (Oral)   Resp 16   Ht 5\' 8"  (1.727 m)  Wt 90.7 kg (200 lb)   SpO2 99%   BMI 30.41 kg/m   Physical Exam  Constitutional: Vital signs are normal. She appears well-developed and well-nourished. She is cooperative. No distress.  Musculoskeletal: Normal range of motion.       Right hip: Normal.       Left hip: Normal.       Right knee: Normal.       Left knee: Normal.  Neurological: She is alert. She has normal strength and normal reflexes. No sensory deficit. She exhibits normal muscle tone.  Skin: Skin is warm, dry and intact. Capillary refill takes less than 2 seconds. She is not diaphoretic.     Insect bite visualized on left inner thigh. Bite has scab over it. No active drainage or bleeding seen. No abscess or areas of induration or fluctuance. Erythema surrounding bite (see pic above).  Nursing note and vitals reviewed.      ED Treatments / Results  Labs (all labs ordered are listed, but only abnormal results are displayed) Labs Reviewed - No data to  display  EKG None  Radiology No results found.  Procedures Procedures (including critical care time)  Medications Ordered in ED Medications - No data to display   Initial Impression / Assessment and Plan / ED Course  Triage vital signs and the nursing notes have been reviewed.  Pertinent labs & imaging results that were available during care of the patient were reviewed and considered in medical decision making (see chart for details).   Patient presents in no acute distress. Vitals were normal upon arrival to ED. Patient's clinical presentation is consistent with cellulitis. Given pt's history, there is little concern that this was tic, spider or other animal bite that requires antitoxin or a different antibiotic besides clindamycin. No signs of abscess, ulceration or necrosis. Patient denies systemic s/s and has full active and passive ROM which assists in ruling out a widespread infection or septic arthritis.  Final Clinical Impressions(s) / ED Diagnoses  1. Cellulitis. Clindamycin 300mg  QID x7 days. Education provided about OTC and supportive treatments that can be used for additonal relief.  Dispo: Home. After thorough clinical evaluation, this patient is determined to be medically stable and can be safely discharged with the previously mentioned treatment and/or outpatient follow-up/referral(s). At this time, there are no other apparent medical conditions that require further screening, evaluation or treatment.  Final diagnoses:  Cellulitis of left lower extremity    ED Discharge Orders        Ordered    clindamycin (CLEOCIN) 150 MG capsule  4 times daily     11/09/17 1610       Windy Carina, PA-C 11/09/17 1011    Tilden Fossa, MD 11/10/17 1558

## 2017-11-09 NOTE — ED Triage Notes (Signed)
Patient reports she has insect bite on the left inner thigh that she notice on Saturday. Patient reports she placed which hazel on it. This morning she notice redness warmth and pain the inner thigh and it has gotten worst. Patient denies any fever.

## 2018-01-22 ENCOUNTER — Emergency Department (HOSPITAL_COMMUNITY)
Admission: EM | Admit: 2018-01-22 | Discharge: 2018-01-22 | Disposition: A | Discharge status: Home / Self Care | Payer: BLUE CROSS/BLUE SHIELD | Attending: Emergency Medicine | Admitting: Emergency Medicine

## 2018-01-22 ENCOUNTER — Encounter (HOSPITAL_COMMUNITY): Payer: Self-pay | Admitting: *Deleted

## 2018-01-22 ENCOUNTER — Other Ambulatory Visit: Payer: Self-pay

## 2018-01-22 DIAGNOSIS — M79645 Pain in left finger(s): Secondary | ICD-10-CM | POA: Diagnosis not present

## 2018-01-22 DIAGNOSIS — R202 Paresthesia of skin: Secondary | ICD-10-CM | POA: Diagnosis not present

## 2018-01-22 DIAGNOSIS — Z87891 Personal history of nicotine dependence: Secondary | ICD-10-CM | POA: Diagnosis not present

## 2018-01-22 DIAGNOSIS — I10 Essential (primary) hypertension: Secondary | ICD-10-CM | POA: Insufficient documentation

## 2018-01-22 NOTE — ED Triage Notes (Signed)
Pt complains of swelling to left ring finger and is unable to remove her gold ring. Pt states she last removed her ring 1 month ago but usually moves her ring around. Pt tried using soap and taking ibuprofen. Pt denies injury to her finger.

## 2018-01-22 NOTE — Discharge Instructions (Signed)
You can take Tylenol or Ibuprofen as directed for pain. You can alternate Tylenol and Ibuprofen every 4 hours. If you take Tylenol at 1pm, then you can take Ibuprofen at 5pm. Then you can take Tylenol again at 9pm.   Apply ice to help with swelling.  Follow-up with your primary care doctor in the next 2 to 4 days.  Return to the emergency department for any worsening pain of the finger, redness or swelling that extends up the finger down the hand, numbness/weakness, if the hand changes colors or any other worsening or concerning symptoms.

## 2018-01-22 NOTE — ED Notes (Signed)
EDPA Provider at bedside. 

## 2018-01-22 NOTE — ED Notes (Addendum)
PT NOT IN ROOM FOR DISCHARGE INSTRUCTIONS. ATTEMPTS MADE TO VERIFY PT HAD LEFT. ROBERT EMT ALSO LOOKED FOR PT. UNABLE TO SIGN

## 2018-01-22 NOTE — ED Provider Notes (Signed)
[facility]-EMERGENCY DEPT Provider Note   CSN: [id number] Arrival date & time: 01/22/18  1605     History   Chief Complaint Chief Complaint  Patient presents with  . unable to remove ring    HPI [name] is a 27 y.o. female past with history of hypertension who presents for evaluation of pain and swelling to left ring finger that began today.  Patient states that she wears a ring on her right fourth digit and states that today, she had difficulty getting her ring off.  She states that the finger became swollen and painful.  She does not recall any specific trauma, injury to the finger.  The patient states that she was worried about circulation.  Patient states that his tingling but she can still feel.  She denies any fevers.  The history is provided by the patient.    Past Medical History:  Diagnosis Date  . Hx of abnormal cervical Pap smear    Pt with several normal paps since abnormal pap  . Hypertension   . Irregular periods    history    Patient Active Problem List   Diagnosis Date Noted  . Hx of abnormal cervical Pap smear   . Essential hypertension 07/05/2017    Past Surgical History:  Procedure Laterality Date  . colposcopy       OB History    Gravida  0   Para  0   Term  0   Preterm  0   AB  0   Living  0     SAB  0   TAB  0   Ectopic  0   Multiple  0   Live Births  0            Home Medications    Prior to Admission medications   Medication Sig Start Date End Date Taking? Authorizing Provider  hydrochlorothiazide (HYDRODIURIL) 12.5 MG tablet Take 1 tablet (12.5 mg total) by mouth daily. 08/17/17   Galen ManilaKennedy, Lauren Renee, NP    Family History Family History  Problem Relation Age of Onset  . Hypertension Mother   . Stroke Mother        mini stroke?  Marland Kitchen. Thyroid disease Mother        thyroid removed  . Hypertension Sister     Social History Social History   Tobacco Use  . Smoking status: Former  Smoker    Packs/day: 0.50    Years: 7.00    Pack years: 3.50    Last attempt to quit: 07/05/2014    Years since quitting: 3.5  . Smokeless tobacco: Never Used  Substance Use Topics  . Alcohol use: Yes    Comment: seldom  . Drug use: No     Allergies   Patient has no known allergies.   Review of Systems Review of Systems  Musculoskeletal:       Finger pain  Neurological: Negative for weakness and numbness.     Physical Exam Updated Vital Signs BP (!) 142/100 (BP Location: Left Arm)   Pulse 70   Temp 98.3 F (36.8 C) (Oral)   Resp 18   SpO2 95%   Physical Exam  Constitutional: She appears well-developed and well-nourished.  HENT:  Head: Normocephalic and atraumatic.  Eyes: Conjunctivae and EOM are normal. Right eye exhibits no discharge. Left eye exhibits no discharge. No scleral icterus.  Cardiovascular:  Pulses:      Radial pulses are 2+ on the right  side, and 2+ on the left side.  Pulmonary/Chest: Effort normal.  Musculoskeletal:  Slight swelling noted to the proximal left fourth digit between the PIP and MCP.  No overlying warmth, erythema.  Ring is placed on left fourth finger.  No tenderness palpation noted to all other digits.  Flexion/extension of wrist intact without any difficulty.  Full range of motion of all 5 digits intact with any difficulty.  No tenderness to palpation noted along the tendon sheath of the finger.  Neurological: She is alert.  Sensation intact along major nerve distributions of left 4th digit.   Skin: Skin is warm and dry. Capillary refill takes less than 2 seconds.  Good distal cap refill. Left 4th digit is not dusky in appearance or cool to touch.  Psychiatric: She has a normal mood and affect. Her speech is normal and behavior is normal.  Nursing note and vitals reviewed.    ED Treatments / Results  Labs (all labs ordered are listed, but only abnormal results are displayed) Labs Reviewed - No data to  display  EKG None  Radiology No results found.  Procedures Procedures (including critical care time)  Medications Ordered in ED Medications - No data to display   Initial Impression / Assessment and Plan / ED Course  I have reviewed the triage vital signs and the nursing notes.  Pertinent labs & imaging results that were available during my care of the patient were reviewed by me and considered in my medical decision making (see chart for details).     27 year old female who presents for evaluation of left fourth digit pain that began today.  Patient reports that her ring is stuck and she cannot get off.  Reports she has had associated swelling to the proximal end of the finger.  She denies any preceding trauma, injury, fall.  Patient states she was concerned because of the ring was on tight and she was worried about her circulation. Patient is afebrile, non-toxic appearing, sitting comfortably on examination table. Vital signs reviewed and stable. Patient is neurovascularly intact.  Patient with good distal cap refill.  Left fourth digit is not dusky in appearance or cool to touch.  There is a ring noted to the proximal end of the left fourth digit with some surrounding soft tissue swelling.  Attempted to remove the ring with surgical lube but was unsuccessful.   Ring was removed with ring cutter.  Reevaluation after ring removed.  Patient reports improvement in pain.  Patient with good distal cap refill.  She is still having some slight swelling noted to the proximal finger.  No overlying warmth, erythema.  Full range of motion intact without any difficulty.  Waiting XR evaluation.   Reevaluation.  Swelling has gone down significantly.  Patient reports improvement in pain.  Patient wishes to decline x-ray evaluation at this time.  I feel this is reasonable.  At this time, patient exhibits no signs that would be concerning for infection.  History/physical exam is not concerning for flexor  tenosynovitis.  I suspect that swelling was in part due to ring that was stuck.  Patient has good distal cap refill and no signs of decreased circulation.  Encourage patient on supportive at home therapies. Patient had ample opportunity for questions and discussion. All patient's questions were answered with full understanding. Strict return precautions discussed. Patient expresses understanding and agreement to plan.   Final Clinical Impressions(s) / ED Diagnoses   Final diagnoses:  Pain of finger of left hand  ED Discharge Orders    None       Rosana Hoes 01/22/18 2108    Gerhard Munch, MD 01/24/18 [phone]

## 2018-01-22 NOTE — ED Notes (Signed)
RING CUT OFF

## 2018-02-11 ENCOUNTER — Encounter (HOSPITAL_COMMUNITY): Payer: Self-pay | Admitting: Emergency Medicine

## 2018-02-11 ENCOUNTER — Emergency Department (HOSPITAL_COMMUNITY)
Admission: EM | Admit: 2018-02-11 | Discharge: 2018-02-11 | Disposition: A | Discharge status: Home / Self Care | Payer: BLUE CROSS/BLUE SHIELD | Attending: Emergency Medicine | Admitting: Emergency Medicine

## 2018-02-11 DIAGNOSIS — I10 Essential (primary) hypertension: Secondary | ICD-10-CM | POA: Insufficient documentation

## 2018-02-11 DIAGNOSIS — Z8742 Personal history of other diseases of the female genital tract: Secondary | ICD-10-CM | POA: Diagnosis not present

## 2018-02-11 DIAGNOSIS — Z87891 Personal history of nicotine dependence: Secondary | ICD-10-CM | POA: Insufficient documentation

## 2018-02-11 DIAGNOSIS — Z79899 Other long term (current) drug therapy: Secondary | ICD-10-CM | POA: Diagnosis not present

## 2018-02-11 DIAGNOSIS — R1030 Lower abdominal pain, unspecified: Secondary | ICD-10-CM | POA: Insufficient documentation

## 2018-02-11 DIAGNOSIS — N939 Abnormal uterine and vaginal bleeding, unspecified: Secondary | ICD-10-CM | POA: Diagnosis not present

## 2018-02-11 LAB — CBG MONITORING, ED: Glucose-Capillary: 76 mg/dL (ref 70–99)

## 2018-02-11 LAB — I-STAT BETA HCG BLOOD, ED (MC, WL, AP ONLY): I-stat hCG, quantitative: <5 m[IU]/mL (ref <5)

## 2018-02-11 MED ORDER — IBUPROFEN 800 MG PO TABS: 800.0000 mg | ORAL_TABLET | Freq: Three times a day (TID) | ORAL | 0 refills | Status: DC | PRN

## 2018-02-11 NOTE — ED Triage Notes (Signed)
Pt c/o vaginal bleeding and cramping x 2 days. Pt states last period was August 17 and was normal period. Pt c/o nausea and emesis.

## 2018-02-11 NOTE — ED Notes (Signed)
Patient has extra blood in the lab

## 2018-02-11 NOTE — ED Provider Notes (Addendum)
[facility]-EMERGENCY DEPT Provider Note   CSN: [id number] Arrival date & time: 02/11/18  1227     History   Chief Complaint Chief Complaint  Patient presents with  . Vaginal Bleeding  . Emesis    HPI [name] is a 27 y.o. female.  Patient complains of irregular bleeding for the last week.  Mild discomfort lower abdomen  The history is provided by the patient. No language interpreter was used.  Vaginal Bleeding  Primary symptoms include vaginal bleeding. There has been no fever. This is a new problem. The problem occurs daily. The problem has not changed since onset.She is not pregnant. She has not missed her period. The patient's menstrual history has been irregular. The discharge was normal. Associated symptoms include vomiting. Pertinent negatives include no abdominal pain, no diarrhea and no frequency. She has tried nothing for the symptoms. The treatment provided no relief. Sexual activity: sexually active. She uses nothing for contraception.  Emesis   Pertinent negatives include no abdominal pain, no cough, no diarrhea and no headaches.    Past Medical History:  Diagnosis Date  . Hx of abnormal cervical Pap smear    Pt with several normal paps since abnormal pap  . Hypertension   . Irregular periods    history    Patient Active Problem List   Diagnosis Date Noted  . Hx of abnormal cervical Pap smear   . Essential hypertension 07/05/2017    Past Surgical History:  Procedure Laterality Date  . colposcopy       OB History    Gravida  0   Para  0   Term  0   Preterm  0   AB  0   Living  0     SAB  0   TAB  0   Ectopic  0   Multiple  0   Live Births  0            Home Medications    Prior to Admission medications   Medication Sig Start Date End Date Taking? Authorizing Provider  hydrochlorothiazide (HYDRODIURIL) 12.5 MG tablet Take 1 tablet (12.5 mg total) by mouth daily. 08/17/17  Yes Galen Manila,  NP  ibuprofen (ADVIL,MOTRIN) 800 MG tablet Take 1 tablet (800 mg total) by mouth every 8 (eight) hours as needed. 02/11/18   Bethann Berkshire, MD    Family History Family History  Problem Relation Age of Onset  . Hypertension Mother   . Stroke Mother        mini stroke?  Marland Kitchen Thyroid disease Mother        thyroid removed  . Hypertension Sister     Social History Social History   Tobacco Use  . Smoking status: Former Smoker    Packs/day: 0.50    Years: 7.00    Pack years: 3.50    Last attempt to quit: 07/05/2014    Years since quitting: 3.6  . Smokeless tobacco: Never Used  Substance Use Topics  . Alcohol use: Yes    Comment: seldom  . Drug use: No     Allergies   Patient has no known allergies.   Review of Systems Review of Systems  Constitutional: Negative for appetite change and fatigue.  HENT: Negative for congestion, ear discharge and sinus pressure.   Eyes: Negative for discharge.  Respiratory: Negative for cough.   Cardiovascular: Negative for chest pain.  Gastrointestinal: Positive for vomiting. Negative for abdominal pain and diarrhea.  Genitourinary:  Positive for vaginal bleeding. Negative for frequency and hematuria.  Musculoskeletal: Negative for back pain.  Skin: Negative for rash.  Neurological: Negative for seizures and headaches.  Psychiatric/Behavioral: Negative for hallucinations.     Physical Exam Updated Vital Signs BP (!) 122/92 (BP Location: Left Arm)   Pulse 78   Temp 98.2 F (36.8 C) (Oral)   Resp 16   LMP 01/22/2018 (Exact Date)   SpO2 100%   Physical Exam  Constitutional: She is oriented to person, place, and time. She appears well-developed.  HENT:  Head: Normocephalic.  Eyes: Conjunctivae and EOM are normal. No scleral icterus.  Neck: Neck supple. No thyromegaly present.  Cardiovascular: Normal rate and regular rhythm. Exam reveals no gallop and no friction rub.  No murmur heard. Pulmonary/Chest: No stridor. She has no wheezes.  She has no rales. She exhibits no tenderness.  Abdominal: She exhibits no distension. There is no tenderness. There is no rebound.  Musculoskeletal: Normal range of motion. She exhibits no edema.  Lymphadenopathy:    She has no cervical adenopathy.  Neurological: She is oriented to person, place, and time. She exhibits normal muscle tone. Coordination normal.  Skin: No rash noted. No erythema.  Psychiatric: She has a normal mood and affect. Her behavior is normal.     ED Treatments / Results  Labs (all labs ordered are listed, but only abnormal results are displayed) Labs Reviewed  I-STAT BETA HCG BLOOD, ED ([facility], [facility], [facility] ONLY)  CBG MONITORING, ED    EKG None  Radiology No results found.  Procedures Procedures (including critical care time)  Medications Ordered in ED Medications - No data to display   Initial Impression / Assessment and Plan / ED Course  I have reviewed the triage vital signs and the nursing notes.  Pertinent labs & imaging results that were available during my care of the patient were reviewed by me and considered in my medical decision making (see chart for details).   Patient with irregular menses.  Pregnancy test negative.  She is given Motrin for pain will follow-up with the GYN    Final Clinical Impressions(s) / ED Diagnoses   Final diagnoses:  Abnormal vaginal bleeding    ED Discharge Orders         Ordered    ibuprofen (ADVIL,MOTRIN) 800 MG tablet  Every 8 hours PRN     02/11/18 1513           Bethann Berkshire, MD 02/11/18 1516    Bethann Berkshire, MD 02/18/18 1248

## 2018-02-11 NOTE — Discharge Instructions (Addendum)
Follow-up with the women's clinic in 2 to 3 weeks if her symptoms do not improve

## 2018-03-31 ENCOUNTER — Telehealth: Payer: Self-pay

## 2018-03-31 NOTE — Telephone Encounter (Signed)
The pt refused sleep study. Pt declined scheduling 2nd sleep study on 03/25/2018.

## 2018-04-08 ENCOUNTER — Encounter: Payer: BLUE CROSS/BLUE SHIELD | Admitting: Certified Nurse Midwife

## 2018-06-15 DIAGNOSIS — N6314 Unspecified lump in the right breast, lower inner quadrant: Secondary | ICD-10-CM | POA: Diagnosis not present

## 2018-06-16 ENCOUNTER — Other Ambulatory Visit: Payer: Self-pay | Admitting: Certified Nurse Midwife

## 2018-06-16 DIAGNOSIS — N6314 Unspecified lump in the right breast, lower inner quadrant: Secondary | ICD-10-CM

## 2018-06-21 ENCOUNTER — Ambulatory Visit
Admission: RE | Admit: 2018-06-21 | Discharge: 2018-06-21 | Disposition: A | Discharge status: Home / Self Care | Payer: BLUE CROSS/BLUE SHIELD | Source: Ambulatory Visit | Attending: Certified Nurse Midwife | Admitting: Certified Nurse Midwife

## 2018-06-21 DIAGNOSIS — N6314 Unspecified lump in the right breast, lower inner quadrant: Secondary | ICD-10-CM | POA: Diagnosis not present

## 2018-06-21 DIAGNOSIS — N6489 Other specified disorders of breast: Secondary | ICD-10-CM | POA: Diagnosis not present

## 2018-07-29 ENCOUNTER — Other Ambulatory Visit: Payer: Self-pay

## 2018-07-29 ENCOUNTER — Encounter: Payer: Self-pay | Admitting: Nurse Practitioner

## 2018-07-29 ENCOUNTER — Ambulatory Visit (INDEPENDENT_AMBULATORY_CARE_PROVIDER_SITE_OTHER): Payer: BLUE CROSS/BLUE SHIELD | Admitting: Nurse Practitioner

## 2018-07-29 ENCOUNTER — Encounter: Payer: BLUE CROSS/BLUE SHIELD | Admitting: Nurse Practitioner

## 2018-07-29 VITALS — BP 116/77 | HR 82 | Temp 99.1°F | Ht 68.0 in | Wt 220.0 lb

## 2018-07-29 DIAGNOSIS — Z0000 Encounter for general adult medical examination without abnormal findings: Secondary | ICD-10-CM

## 2018-07-29 DIAGNOSIS — N921 Excessive and frequent menstruation with irregular cycle: Secondary | ICD-10-CM

## 2018-07-29 DIAGNOSIS — Z131 Encounter for screening for diabetes mellitus: Secondary | ICD-10-CM | POA: Diagnosis not present

## 2018-07-29 DIAGNOSIS — E282 Polycystic ovarian syndrome: Secondary | ICD-10-CM

## 2018-07-29 DIAGNOSIS — Z30019 Encounter for initial prescription of contraceptives, unspecified: Secondary | ICD-10-CM | POA: Diagnosis not present

## 2018-07-29 LAB — POCT URINE PREGNANCY: Preg Test, Ur: NEGATIVE

## 2018-07-29 MED ORDER — NORETHINDRONE 0.35 MG PO TABS: 1.0000 | ORAL_TABLET | Freq: Every day | ORAL | 11 refills | Status: DC

## 2018-07-29 NOTE — Patient Instructions (Addendum)
Barnet Glasgow,   Thank you for coming in to clinic today.  1. For irregular menstrual bleeding, start norethindrone 0.35 mg one tablet daily for cycle regulation and control of bleeding.  This is contraception, but will not interfere with ovulation as significantly as estrogen-based contraception to stop for becoming pregnant once cycles are controlled.  You may stop this medication at any time if you decide you want to continue trying to become pregnant.  2. Recommend GYN visit to discuss conception (becoming pregnant) further with PCOS.  3. Discussed MyFitnessPal.  Establish baseline intake and reduce by about 500 calories per day probably around 1400 - 1600 calories per day.  Continue for 1-2 weeks, then stop calorie counting, continue food log, and continue new eating pattern. - Weight loss goals 1/2 to 1 lb weekly. - We may consider using metformin to assist with weight control due to PCOS.   Please schedule a follow-up appointment with [name], AGNP. Return in about 1 year (around 07/30/2019) for annual physical AND in 6 months for hypertension.  If you have any other questions or concerns, please feel free to call the clinic or send a message through MyChart. You may also schedule an earlier appointment if necessary.  You will receive a survey after today's visit either digitally by e-mail or paper by [employer]. Your experiences and feedback matter to [place].  Please respond so we know how we are doing as we provide care for you.   [name], DNP, AGNP-BC Adult Gerontology Nurse Practitioner [facility]

## 2018-07-29 NOTE — Progress Notes (Signed)
Subjective:    Patient ID: [name], female    DOB: [birth year], 28 y.o.   MRN: [id number]  [name] is a 28 y.o. female presenting on 07/29/2018 for Annual Exam   HPI Annual Physical Exam Patient has been feeling well.  They have acute concerns today regarding desire for pregnancy. Sleeps 6-8 hours per night uninterrupted usually.  HEALTH MAINTENANCE: Weight/BMI: obese class 1, increased Physical activity: once-twice weekly Diet: generally high fat, high carb Seatbelt: always Sunscreen: not regular PAP: due 2021 Mammogram: normal -  HIV/HEP C: neg in past, no new partners Optometry: not regularly Dentistry: not regularly  VACCINES: Tetanus: up to date Influenza: declined this year  24-hr diet recall: Oatmeal breakfast [employer] with fruit and maple, one doughnut (Local shop) with caramel frosting (special occasion) Lunch: made baked chicken with cream of chicken soup, mashed potatoes, green beans No other food after lunch  Encounter Conception/ PCOS/ Menorrhagia Patient is having problems with period, breakthrough bleeding and PCOS with irregular menses.   - Patient was bleeding, not cramping at beginning of the month.  Sun started cramping and bleeding.  - Patient has not seen a GYN for PCOS.   - Patient regularly has periods that last > 12-14 days with moderate to heavy bleeding with regular clots.     Social History   Tobacco Use  . Smoking status: Former Smoker    Packs/day: 0.50    Years: 7.00    Pack years: 3.50    Last attempt to quit: 07/05/2014    Years since quitting: 4.0  . Smokeless tobacco: Never Used  Substance Use Topics  . Alcohol use: Yes    Comment: seldom  . Drug use: No    Review of Systems Per HPI unless specifically indicated above     Objective:    BP 116/77 (BP Location: Left Arm, Patient Position: Sitting, Cuff Size: Large)   Pulse 82   Temp 99.1 F (37.3 C) (Oral)   Ht 5\' 8"  (1.727 m)   Wt 220 lb (99.8 kg)    LMP 07/24/2018   BMI 33.45 kg/m   Wt Readings from Last 3 Encounters:  07/29/18 220 lb (99.8 kg)  11/09/17 200 lb (90.7 kg)  08/17/17 210 lb 6.4 oz (95.4 kg)    Physical Exam Vitals signs and nursing note reviewed.  Constitutional:      General: She is not in acute distress.    Appearance: She is well-developed.  HENT:     Head: Normocephalic and atraumatic.     Right Ear: External ear normal.     Left Ear: External ear normal.     Nose: Nose normal.  Eyes:     Conjunctiva/sclera: Conjunctivae normal.     Pupils: Pupils are equal, round, and reactive to light.  Neck:     Musculoskeletal: Normal range of motion and neck supple.     Thyroid: No thyromegaly.     Vascular: No JVD.     Trachea: No tracheal deviation.  Cardiovascular:     Rate and Rhythm: Normal rate and regular rhythm.     Heart sounds: Normal heart sounds. No murmur. No friction rub. No gallop.   Pulmonary:     Effort: Pulmonary effort is normal. No respiratory distress.     Breath sounds: Normal breath sounds.  Chest:     Comments: Breast - Normal exam w/ symmetric breasts, no mass, no nipple discharge, no skin changes or tenderness. Abdominal:     General: Bowel  sounds are normal. There is no distension.     Palpations: Abdomen is soft.     Tenderness: There is no abdominal tenderness.  Genitourinary:    Comments: Deferred today by patient due to menses Musculoskeletal: Normal range of motion.  Lymphadenopathy:     Cervical: No cervical adenopathy.  Skin:    General: Skin is warm and dry.     Capillary Refill: Capillary refill takes less than 2 seconds.  Neurological:     General: No focal deficit present.     Mental Status: She is alert and oriented to person, place, and time. Mental status is at baseline.     Cranial Nerves: No cranial nerve deficit.     Motor: No weakness.     Gait: Gait normal.     Deep Tendon Reflexes: Reflexes normal.  Psychiatric:        Mood and Affect: Mood normal.         Behavior: Behavior normal.        Thought Content: Thought content normal.        Judgment: Judgment normal.      Results for orders placed or performed during the hospital encounter of 02/11/18  I-Stat beta hCG blood, ED  Result Value Ref Range   I-stat hCG, quantitative <5.0 <5 mIU/mL   Comment 3          CBG monitoring, ED  Result Value Ref Range   Glucose-Capillary 76 70 - 99 mg/dL      Assessment & Plan:   Problem List Items Addressed This Visit    None    Visit Diagnoses    Encounter for annual physical exam    -  Primary   Relevant Orders   TSH (Completed)   Lipid panel (Completed)   COMPLETE METABOLIC PANEL WITH GFR (Completed)   Hemoglobin A1c (Completed)   CBC with Differential/Platelet (Completed)   Diabetes mellitus screening       Relevant Orders   Hemoglobin A1c (Completed)   Menorrhagia with irregular cycle       Relevant Medications   norethindrone (MICRONOR,CAMILA,ERRIN) 0.35 MG tablet   PCOS (polycystic ovarian syndrome)       Encounter for initial prescription of contraceptives, unspecified contraceptive       Relevant Orders   POCT urine pregnancy (Completed)      Annual physical exam with new findings as below.  Well adult with no acute concerns.  Plan: 1. Obtain health maintenance screenings as above according to age. - Increase physical activity to 30 minutes most days of the week.  - Eat healthy diet high in vegetables and fruits; low in refined carbohydrates. - Screening labs and tests as ordered 2. Return 1 year for annual physical.    Desire for conception, Menorrhagia, PCOS Irregular menstrual cycle with menorrhagia is problematic for patient.  Desires conception and has not been able to conceive when having regular intercourse and not using contraception.  Plan: 1. Encouraged patient to continue regular follow-up with GYN provider as they will be experts at fertility especially with PCOS.   - Discussed tracking periods - patient  already is - Discussed irregular and unpredictable ovulation patterns with PCOS 2. For menorrhagia, have decided to start POP contraception for several cycles to augment bleeding time.  Patient to discuss further with GYN and change therapy as directed.  Patient aware this will be a form of contraception and she will likely not get pregnant while taking this med.  Also discussed need  to take this med at consistent time of day to prevent breakthrough bleeding. 3. Follow-up prn and with OB-GYN.  Consider future fertility specialist if needed.   Meds ordered this encounter  Medications  . norethindrone (MICRONOR,CAMILA,ERRIN) 0.35 MG tablet    Sig: Take 1 tablet (0.35 mg total) by mouth daily.    Dispense:  1 Package    Refill:  11    Order Specific Question:   Supervising Provider    Answer:   Smitty Cords [[id number]]    Follow up plan: Return in about 1 year (around 07/30/2019) for annual physical AND in 6 months for hypertension.  [name], DNP, AGPCNP-BC Adult Gerontology Primary Care Nurse Practitioner [facility] 07/29/2018, 10:12 AM

## 2018-07-30 LAB — CBC WITH DIFFERENTIAL/PLATELET
Absolute Monocytes: 502 cells/uL (ref 200–950)
Basophils Absolute: 49 cells/uL (ref 0–200)
Basophils Relative: 0.6 %
Eosinophils Absolute: 486 cells/uL (ref 15–500)
Eosinophils Relative: 6 %
HCT: 35.1 % (ref 35.0–45.0)
Hemoglobin: 11.7 g/dL (ref 11.7–15.5)
Lymphs Abs: 1798 cells/uL (ref 850–3900)
MCH: 28.6 pg (ref 27.0–33.0)
MCHC: 33.3 g/dL (ref 32.0–36.0)
MCV: 85.8 fL (ref 80.0–100.0)
MPV: 10.9 fL (ref 7.5–12.5)
Monocytes Relative: 6.2 %
Neutro Abs: 5265 cells/uL (ref 1500–7800)
Neutrophils Relative %: 65 %
Platelets: 398 10*3/uL (ref 140–400)
RBC: 4.09 10*6/uL (ref 3.80–5.10)
RDW: 14.6 % (ref 11.0–15.0)
Total Lymphocyte: 22.2 %
WBC: 8.1 10*3/uL (ref 3.8–10.8)

## 2018-07-30 LAB — COMPLETE METABOLIC PANEL WITH GFR
AG Ratio: 1.4 (calc) (ref 1.0–2.5)
ALT: 23 U/L (ref 6–29)
AST: 15 U/L (ref 10–30)
Albumin: 3.7 g/dL (ref 3.6–5.1)
Alkaline phosphatase (APISO): 47 U/L (ref 31–125)
BUN: 10 mg/dL (ref 7–25)
CO2: 27 mmol/L (ref 20–32)
Calcium: 9 mg/dL (ref 8.6–10.2)
Chloride: 106 mmol/L (ref 98–110)
Creat: 0.8 mg/dL (ref 0.50–1.10)
GFR, Est African American: 115 mL/min/{1.73_m2} (ref >=60)
GFR, Est Non African American: 100 mL/min/{1.73_m2} (ref >=60)
Globulin: 2.6 g/dL (calc) (ref 1.9–3.7)
Glucose, Bld: 85 mg/dL (ref 65–99)
Potassium: 3.7 mmol/L (ref 3.5–5.3)
Sodium: 140 mmol/L (ref 135–146)
Total Bilirubin: 0.6 mg/dL (ref 0.2–1.2)
Total Protein: 6.3 g/dL (ref 6.1–8.1)

## 2018-07-30 LAB — LIPID PANEL
Cholesterol: 153 mg/dL (ref <200)
HDL: 41 mg/dL — ABNORMAL LOW (ref >=50)
LDL Cholesterol (Calc): 90 mg/dL (calc)
Non-HDL Cholesterol (Calc): 112 mg/dL (calc) (ref <130)
Total CHOL/HDL Ratio: 3.7 (calc) (ref <5.0)
Triglycerides: 127 mg/dL (ref <150)

## 2018-07-30 LAB — HEMOGLOBIN A1C
Hgb A1c MFr Bld: 5.4 % of total Hgb (ref <5.7)
Mean Plasma Glucose: 108 (calc)
eAG (mmol/L): 6 (calc)

## 2018-07-30 LAB — TSH: TSH: 0.93 mIU/L

## 2018-08-01 ENCOUNTER — Encounter: Payer: Self-pay | Admitting: Nurse Practitioner

## 2018-08-28 ENCOUNTER — Other Ambulatory Visit: Payer: Self-pay | Admitting: Nurse Practitioner

## 2018-08-28 DIAGNOSIS — I10 Essential (primary) hypertension: Secondary | ICD-10-CM

## 2018-09-30 DIAGNOSIS — R05 Cough: Secondary | ICD-10-CM | POA: Diagnosis not present

## 2018-09-30 DIAGNOSIS — R079 Chest pain, unspecified: Secondary | ICD-10-CM | POA: Diagnosis not present

## 2018-10-05 ENCOUNTER — Emergency Department (HOSPITAL_COMMUNITY): Payer: BLUE CROSS/BLUE SHIELD

## 2018-10-05 ENCOUNTER — Other Ambulatory Visit: Payer: Self-pay

## 2018-10-05 ENCOUNTER — Emergency Department (HOSPITAL_COMMUNITY)
Admission: EM | Admit: 2018-10-05 | Discharge: 2018-10-05 | Disposition: A | Discharge status: Home / Self Care | Payer: BLUE CROSS/BLUE SHIELD | Attending: Emergency Medicine | Admitting: Emergency Medicine

## 2018-10-05 DIAGNOSIS — Z87891 Personal history of nicotine dependence: Secondary | ICD-10-CM | POA: Insufficient documentation

## 2018-10-05 DIAGNOSIS — Z20828 Contact with and (suspected) exposure to other viral communicable diseases: Secondary | ICD-10-CM | POA: Diagnosis not present

## 2018-10-05 DIAGNOSIS — R05 Cough: Secondary | ICD-10-CM | POA: Diagnosis not present

## 2018-10-05 DIAGNOSIS — R6889 Other general symptoms and signs: Secondary | ICD-10-CM

## 2018-10-05 DIAGNOSIS — R0789 Other chest pain: Secondary | ICD-10-CM | POA: Diagnosis not present

## 2018-10-05 DIAGNOSIS — I10 Essential (primary) hypertension: Secondary | ICD-10-CM | POA: Diagnosis not present

## 2018-10-05 DIAGNOSIS — Z20822 Contact with and (suspected) exposure to covid-19: Secondary | ICD-10-CM

## 2018-10-05 DIAGNOSIS — R51 Headache: Secondary | ICD-10-CM | POA: Insufficient documentation

## 2018-10-05 DIAGNOSIS — Z79899 Other long term (current) drug therapy: Secondary | ICD-10-CM | POA: Insufficient documentation

## 2018-10-05 DIAGNOSIS — R21 Rash and other nonspecific skin eruption: Secondary | ICD-10-CM | POA: Insufficient documentation

## 2018-10-05 DIAGNOSIS — R079 Chest pain, unspecified: Secondary | ICD-10-CM | POA: Diagnosis not present

## 2018-10-05 LAB — CBC WITH DIFFERENTIAL/PLATELET
Abs Immature Granulocytes: 0.02 10*3/uL (ref 0.00–0.07)
Basophils Absolute: 0.1 10*3/uL (ref 0.0–0.1)
Basophils Relative: 1 %
Eosinophils Absolute: 0.4 10*3/uL (ref 0.0–0.5)
Eosinophils Relative: 4 %
HCT: 38.2 % (ref 36.0–46.0)
Hemoglobin: 12.2 g/dL (ref 12.0–15.0)
Immature Granulocytes: 0 %
Lymphocytes Relative: 24 %
Lymphs Abs: 2.3 10*3/uL (ref 0.7–4.0)
MCH: 27.8 pg (ref 26.0–34.0)
MCHC: 31.9 g/dL (ref 30.0–36.0)
MCV: 87 fL (ref 80.0–100.0)
Monocytes Absolute: 0.6 10*3/uL (ref 0.1–1.0)
Monocytes Relative: 7 %
Neutro Abs: 6.2 10*3/uL (ref 1.7–7.7)
Neutrophils Relative %: 64 %
Platelets: 403 10*3/uL — ABNORMAL HIGH (ref 150–400)
RBC: 4.39 MIL/uL (ref 3.87–5.11)
RDW: 13.7 % (ref 11.5–15.5)
WBC: 9.6 10*3/uL (ref 4.0–10.5)
nRBC: 0 % (ref 0.0–0.2)

## 2018-10-05 LAB — BASIC METABOLIC PANEL
Anion gap: 11 (ref 5–15)
BUN: 9 mg/dL (ref 6–20)
CO2: 26 mmol/L (ref 22–32)
Calcium: 9.1 mg/dL (ref 8.9–10.3)
Chloride: 101 mmol/L (ref 98–111)
Creatinine, Ser: 0.88 mg/dL (ref 0.44–1.00)
GFR calc Af Amer: >60 mL/min (ref >60)
GFR calc non Af Amer: >60 mL/min (ref >60)
Glucose, Bld: 84 mg/dL (ref 70–99)
Potassium: 3.3 mmol/L — ABNORMAL LOW (ref 3.5–5.1)
Sodium: 138 mmol/L (ref 135–145)

## 2018-10-05 LAB — C-REACTIVE PROTEIN: CRP: 1.7 mg/dL — ABNORMAL HIGH (ref <1.0)

## 2018-10-05 LAB — SEDIMENTATION RATE: Sed Rate: 24 mm/hr — ABNORMAL HIGH (ref 0–22)

## 2018-10-05 LAB — TROPONIN I: Troponin I: <0.03 ng/mL (ref <0.03)

## 2018-10-05 MED ORDER — IBUPROFEN 400 MG PO TABS
600.0000 mg | ORAL_TABLET | Freq: Once | ORAL | Status: AC
  Administered 2018-10-05: 600 mg via ORAL
  Filled 2018-10-05: qty 1

## 2018-10-05 MED ORDER — LIDOCAINE 5 % EX PTCH
1.0000 | MEDICATED_PATCH | CUTANEOUS | Status: DC
  Administered 2018-10-05: 23:00:00 1 via TRANSDERMAL
  Filled 2018-10-05: qty 1

## 2018-10-05 NOTE — Discharge Instructions (Addendum)
You were seen in the ED for cough, headaches, chest pain.  I suspect you have a virus, and your chest wall pain may be from inflammation of  your chest wall, lungs or heart.  This will be treated with 600 mg ibuprofen (advil or motrin) every 8 hours for a total of 7 days.    Given current outbreak, we will manage symptoms under presumed COVID-19 (coronavirus) infection.  It is also possible you could have other viral upper respiratory infection as well.  We discussed formal testing, given your low risk and limited resources we opted to defer formal COVID testing and manage you as suspected COVID-19 infection.  Testing would not have changed our management.    Management will include self-isolation, monitoring of symptoms and supportive care with over-the-counter medicines.    Return to the ED if there is increased work of breathing, shortness of breath, inability to tolerate fluids, weakness, chest pain with exertion or activity, palpitations, light-headedness, worsening rash or painful lesions on your skin  The Department of Public Health is recommending the following isolation recommendations.  All 3 of the following must be met to complete isolation recommendations:  At least 7 days since symptom onset (approximately 5/6) AND 72 hours of no of fever without antifever medicine (ibuprofen, acetaminophen). A fever is temperature of 100.33F or greater. Improvement of respiratory symptoms   Stay well-hydrated. Rest. You can use over the counter medications to help with symptoms: Acetaminophen (tylenol) every 6 hours, around the clock to help with associated fevers, sore throat, headaches, generalized body aches and malaise. Acetaminophen can be taken with ibuprofen.  Oxymetazoline (afrin) intranasal spray once daily for no more than 3 days to help with congestion, after 3 days you can switch to another over-the-counter nasal steroid spray such as fluticasone (flonase) Allergy medication (loratadine,  cetirizine, etc) and phenylephrine (sudafed) help with nasal congestion, runny nose and postnasal drip.   Dextromethorphan (Delsym) to suppress dry cough. Frequent coughing is likely causing your chest wall pain Wash your hands often to prevent spread.    Infection Prevention Recommendations for Individuals Confirmed to have, or Being Evaluated for, or have symptoms of 2019 Novel Coronavirus (COVID-19) Infection Who Receive Care at Home  Individuals who are confirmed to have, or are being evaluated for, COVID-19 should follow the prevention steps below until a healthcare provider or local or state health department says they can return to normal activities.  Stay home except to get medical care You should restrict activities outside your home, except for getting medical care. Do not go to work, school, or public areas, and do not use public transportation or taxis.  Call ahead before visiting your doctor Before your medical appointment, call the healthcare provider and tell them that you have, or are being evaluated for, COVID-19 infection. This will help the healthcare providers office take steps to keep other people from getting infected. Ask your healthcare provider to call the local or state health department.  Monitor your symptoms Seek prompt medical attention if your illness is worsening (e.g., difficulty breathing). Before going to your medical appointment, call the healthcare provider and tell them that you have, or are being evaluated for, COVID-19 infection. Ask your healthcare provider to call the local or state health department.  Wear a facemask You should wear a facemask that covers your nose and mouth when you are in the same room with other people and when you visit a healthcare provider. People who live with or visit you should  also wear a facemask while they are in the same room with you.  Separate yourself from other people in your home As much as possible, you should  stay in a different room from other people in your home. Also, you should use a separate bathroom, if available.  Avoid sharing household items You should not share dishes, drinking glasses, cups, eating utensils, towels, bedding, or other items with other people in your home. After using these items, you should wash them thoroughly with soap and water.  Cover your coughs and sneezes Cover your mouth and nose with a tissue when you cough or sneeze, or you can cough or sneeze into your sleeve. Throw used tissues in a lined trash can, and immediately wash your hands with soap and water for at least 20 seconds or use an alcohol-based hand rub.  Wash your [facility] your hands often and thoroughly with soap and water for at least 20 seconds. You can use an alcohol-based hand sanitizer if soap and water are not available and if your hands are not visibly dirty. Avoid touching your eyes, nose, and mouth with unwashed hands.   Prevention Steps for Caregivers and Household Members of Individuals Confirmed to have, or Being Evaluated for, or have symptoms of 2019 Novel Coronavirus (COVID-19) Infection Being Cared for in the Home  If you live with, or provide care at home for, a person confirmed to have, or being evaluated for, COVID-19 infection please follow these guidelines to prevent infection:  Follow healthcare providers instructions Make sure that you understand and can help the patient follow any healthcare provider instructions for all care.  Provide for the patients basic needs You should help the patient with basic needs in the home and provide support for getting groceries, prescriptions, and other personal needs.  Monitor the patients symptoms If they are getting sicker, call his or her medical provider and tell them that the patient has, or is being evaluated for, COVID-19 infection. This will help the healthcare providers office take steps to keep other people from getting  infected. Ask the healthcare provider to call the local or state health department.  Limit the number of people who have contact with the patient If possible, have only one caregiver for the patient. Other household members should stay in another home or place of residence. If this is not possible, they should stay in another room, or be separated from the patient as much as possible. Use a separate bathroom, if available. Restrict visitors who do not have an essential need to be in the home.  Keep older adults, very young children, and other sick people away from the patient Keep older adults, very young children, and those who have compromised immune systems or chronic health conditions away from the patient. This includes people with chronic heart, lung, or kidney conditions, diabetes, and cancer.  Ensure good ventilation Make sure that shared spaces in the home have good air flow, such as from an air conditioner or an opened window, weather permitting.  Wash your hands often Wash your hands often and thoroughly with soap and water for at least 20 seconds. You can use an alcohol based hand sanitizer if soap and water are not available and if your hands are not visibly dirty. Avoid touching your eyes, nose, and mouth with unwashed hands. Use disposable paper towels to dry your hands. If not available, use dedicated cloth towels and replace them when they become wet.  Wear a facemask and gloves  Wear a disposable facemask at all times in the room and gloves when you touch or have contact with the patients blood, body fluids, and/or secretions or excretions, such as sweat, saliva, sputum, nasal mucus, vomit, urine, or feces.  Ensure the mask fits over your nose and mouth tightly, and do not touch it during use. Throw out disposable facemasks and gloves after using them. Do not reuse. Wash your hands immediately after removing your facemask and gloves. If your personal clothing becomes  contaminated, carefully remove clothing and launder. Wash your hands after handling contaminated clothing. Place all used disposable facemasks, gloves, and other waste in a lined container before disposing them with other household waste. Remove gloves and wash your hands immediately after handling these items.  Do not share dishes, glasses, or other household items with the patient Avoid sharing household items. You should not share dishes, drinking glasses, cups, eating utensils, towels, bedding, or other items with a patient who is confirmed to have, or being evaluated for, COVID-19 infection. After the person uses these items, you should wash them thoroughly with soap and water.  Wash laundry thoroughly Immediately remove and wash clothes or bedding that have blood, body fluids, and/or secretions or excretions, such as sweat, saliva, sputum, nasal mucus, vomit, urine, or feces, on them. Wear gloves when handling laundry from the patient. Read and follow directions on labels of laundry or clothing items and detergent. In general, wash and dry with the warmest temperatures recommended on the label.  Clean all areas the individual has used often Clean all touchable surfaces, such as counters, tabletops, doorknobs, bathroom fixtures, toilets, phones, keyboards, tablets, and bedside tables, every day. Also, clean any surfaces that may have blood, body fluids, and/or secretions or excretions on them. Wear gloves when cleaning surfaces the patient has come in contact with. Use a diluted bleach solution (e.g., dilute bleach with 1 part bleach and 10 parts water) or a household disinfectant with a label that says EPA-registered for coronaviruses. To make a bleach solution at home, add 1 tablespoon of bleach to 1 quart (4 cups) of water. For a larger supply, add  cup of bleach to 1 gallon (16 cups) of water. Read labels of cleaning products and follow recommendations provided on product labels. Labels  contain instructions for safe and effective use of the cleaning product including precautions you should take when applying the product, such as wearing gloves or eye protection and making sure you have good ventilation during use of the product. Remove gloves and wash hands immediately after cleaning.  Monitor yourself for signs and symptoms of illness Caregivers and household members are considered close contacts, should monitor their health, and will be asked to limit movement outside of the home to the extent possible. Follow the monitoring steps for close contacts listed on the symptom monitoring form.  ? If you have additional questions, contact your local health department or call the epidemiologist on call at [phone] (available 24/7). ? This guidance is subject to change. For the most up-to-date guidance from [facility], please refer to their website: [email or web address]

## 2018-10-05 NOTE — ED Provider Notes (Signed)
[facility] EMERGENCY DEPARTMENT Provider Note   CSN: [id number] Arrival date & time: 10/05/18  1827    History   Chief Complaint Chief Complaint  Patient presents with   Chest Pain   Rash   Cough    HPI [name] is a 28 y.o. female history of hypertension is here for evaluation of concern for COVID-19 infection.  Reports 1 week of "mild" dry cough.  Associated with headaches, constant, sharp central chest pain x3 days, worse with pushing her chest out, moving, sitting forward and laying back  This is nonexertional, nonpleuritic.  Has also noticed some bumps on her skin for the last 2 days that began on her upper abdomen, sometimes pruritic, nontender.  Now bumps spread to inframammary folds, bilateral forearms, upper thighs.  Person at work tested positive for COVID, her job HR department notified her on Thursday.  Person works within 6 ft from her and was out of work all last week.  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  Lives by herself. No other sick contacts. No recent travel. No interventions. No alleviating or aggravating factors.  No IVUD. No family h/o CAD.   [name] was evaluated in Emergency Department on 10/05/2018 for the symptoms described in the history of present illness. She was evaluated in the context of the global COVID-19 pandemic, which necessitated consideration that the patient might be at risk for infection with the SARS-CoV-2 virus that causes COVID-19. Institutional protocols and algorithms that pertain to the evaluation of patients at risk for COVID-19 are in a state of rapid change based on information released by regulatory bodies including the CDC and federal and state organizations. These policies and algorithms were followed during the patient's care in the ED. HPI  Past Medical History:  Diagnosis Date   Hx of abnormal cervical Pap smear    Pt with several normal paps since  abnormal pap   Hypertension    Irregular periods    history    Patient Active Problem List   Diagnosis Date Noted   Hx of abnormal cervical Pap smear    Essential hypertension 07/05/2017    Past Surgical History:  Procedure Laterality Date   colposcopy       OB History    Gravida  0   Para  0   Term  0   Preterm  0   AB  0   Living  0     SAB  0   TAB  0   Ectopic  0   Multiple  0   Live Births  0            Home Medications    Prior to Admission medications   Medication Sig Start Date End Date Taking? Authorizing Provider  hydrochlorothiazide (HYDRODIURIL) 12.5 MG tablet TAKE 1 TABLET BY MOUTH EVERY DAY Patient taking differently: Take 12.5 mg by mouth daily.  08/29/18  Yes Galen Manila, NP    Family History Family History  Problem Relation Age of Onset   Hypertension Mother    Stroke Mother        mini stroke?   Thyroid disease Mother        thyroid removed   Hypertension Sister     Social History Social History   Tobacco Use   Smoking status: Former Smoker    Packs/day: 0.50    Years: 7.00    Pack years: 3.50  Last attempt to quit: 07/05/2014    Years since quitting: 4.2   Smokeless tobacco: Never Used  Substance Use Topics   Alcohol use: Yes    Comment: seldom   Drug use: No     Allergies   Patient has no known allergies.   Review of Systems Review of Systems  Respiratory: Positive for cough.   Cardiovascular: Positive for chest pain.  Skin: Positive for rash.  Neurological: Positive for headaches.  All other systems reviewed and are negative.    Physical Exam Updated Vital Signs BP 108/81    Pulse 67    Temp 98.7 F (37.1 C) (Oral)    Resp (!) 8    Ht  (1.727 m)    Wt 102.1 kg    SpO2 100%    BMI 34.21 kg/m   Physical Exam Constitutional:      Appearance: She is well-developed.     Comments: NAD. Non toxic. Coughing during exam.   HENT:     Head: Normocephalic and atraumatic.       Nose: Nose normal.  Eyes:     General: Lids are normal.     Conjunctiva/sclera: Conjunctivae normal.  Neck:     Musculoskeletal: Normal range of motion.     Trachea: Trachea normal.     Comments: Trachea midline.  Cardiovascular:     Rate and Rhythm: Normal rate and regular rhythm.     Pulses:          Radial pulses are 2+ on the right side and 2+ on the left side.       Dorsalis pedis pulses are 2+ on the right side and 2+ on the left side.     Heart sounds: Normal heart sounds, S1 normal and S2 normal.     Comments: No LE edema or calf tenderness.  Pulmonary:     Effort: Pulmonary effort is normal.     Breath sounds: Normal breath sounds.     Comments: No reproducible chest wall tenderness  Abdominal:     General: Bowel sounds are normal.     Palpations: Abdomen is soft.     Tenderness: There is no abdominal tenderness.     Comments: No epigastric tenderness. No distention.   Skin:    General: Skin is warm and dry.     Capillary Refill: Capillary refill takes less than 2 seconds.     Findings: Rash present. Rash is papular.     Comments: No rash to chest wall. Very few isolated mildly erythematous papules to upper abdomen, lower abdomen, right forearm and left flank. No Janeway lesions or splinter hemorrhages   Neurological:     Mental Status: She is alert.     GCS: GCS eye subscore is 4. GCS verbal subscore is 5. GCS motor subscore is 6.  Psychiatric:        Speech: Speech normal.        Behavior: Behavior normal.        Thought Content: Thought content normal.      ED Treatments / Results  Labs (all labs ordered are listed, but only abnormal results are displayed) Labs Reviewed  CBC WITH DIFFERENTIAL/PLATELET - Abnormal; Notable for the following components:      Result Value   Platelets 403 (*)    All other components within normal limits  BASIC METABOLIC PANEL - Abnormal; Notable for the following components:   Potassium 3.3 (*)    All other components  within normal  limits  C-REACTIVE PROTEIN - Abnormal; Notable for the following components:   CRP 1.7 (*)    All other components within normal limits  SEDIMENTATION RATE - Abnormal; Notable for the following components:   Sed Rate 24 (*)    All other components within normal limits  TROPONIN I  HCG, SERUM, QUALITATIVE    EKG None  Radiology Dg Chest Portable 1 View  Result Date: 10/05/2018 CLINICAL DATA:  28 year old female with chest pain EXAM: PORTABLE CHEST 1 VIEW COMPARISON:  None. FINDINGS: The heart size and mediastinal contours are within normal limits. Both lungs are clear. The visualized skeletal structures are unremarkable. IMPRESSION: Negative for acute cardiopulmonary disease Electronically Signed   By: Gilmer MorJaime  Wagner D.O.   On: 10/05/2018 19:55    Procedures Procedures (including critical care time)  Medications Ordered in ED Medications  lidocaine (LIDODERM) 5 % 1 patch (1 patch Transdermal Patch Applied 10/05/18 2248)  ibuprofen (ADVIL) tablet 600 mg (600 mg Oral Given 10/05/18 2247)     Initial Impression / Assessment and Plan / ED Course  I have reviewed the triage vital signs and the nursing notes.  Pertinent labs & imaging results that were available during my care of the patient were reviewed by me and considered in my medical decision making (see chart for details).  Clinical Course as of Oct 05 12  Wed Oct 05, 2018  2046 Sed Rate(!): 24 [CG]  2159 CRP(!): 1.7 [CG]    Clinical Course User Index [CG] Liberty HandyGibbons, Laiklyn Pilkenton J, PA-C       Highest on ddx is MSK etiology from coughing vs costochondritis vs pleurisy vs pericarditis.  Recent exposure to confirmed COVID so this could be the underlying cause of her other symptoms, however symptoms and exam reassuring and likely mild viral viral/COVID infection.  Very unlikely to be PE, she has no pleuritic CP, SOB, hypoxia or other risk factors for PE, leg swelling or calf pain.  Very atypical to be ACS, her CP has  been constant for 3 days and HEART less than 3.  No IVDU, murmurs, janeway lesions or splinter hemorrhages, unlikely to be endocarditis.  Will obtain CXR, EKG, labs including sed rate/CRP.  Ibuprofen and lidocaine patch.   2228: CP has been constant since arrival, discussed pericarditis and endocarditis with patient. Reassured her that her symptoms are not consistent with this but educated on what to monitor for.  Ambulated without symptoms, hypoxia. Will repeat EKG for better capture of lateral leads.   Final Clinical Impressions(s) / ED Diagnoses   0015: EKG with NSR. Trop undetectable, no indication for delta given CP constant >3hours. Suspect viral URI possibly COVID-19 with chest wall pain likely MSK vs costochondritis vs pleurisy. Sed rate/CRP minimally elevated and early pericarditis also being considered. Will dc with ibuprofen 600 mg TID, supportive care for suspected COVID-19. I discussed her overall low risk profile for complication and given resources there was no indication for formal testing today.  Pt understands this and signs/symptoms that would warrant return to ED.  Recommended PCP telemedicine visit for further guidance. Instructed self-isolation for a total of 7 days since symptoms onset, 3 days without fevers and improvement in symptoms before returning to essential activities/work. Pt was given home self-isolation instructions and instructions for family members.   Final diagnoses:  Suspected 2019 Novel Coronavirus Infection  Chest wall pain    ED Discharge Orders    None       Liberty HandyGibbons, Jan Walters J, PA-C 10/06/18 0014  Melene Plan, DO 10/06/18 [phone]

## 2018-10-05 NOTE — ED Notes (Signed)
Pts oxygen remained above 96 during ambulation. No complaints of fatigue or weakness.

## 2018-10-05 NOTE — ED Triage Notes (Signed)
Arrives with cough x1 week, chest pain x3 days, rash on stomach. No known fevers (pt does not have a thermometer at home) or shortness of breath, but coworker tested positive for COVID on Thursday

## 2018-10-14 ENCOUNTER — Ambulatory Visit (INDEPENDENT_AMBULATORY_CARE_PROVIDER_SITE_OTHER): Payer: BLUE CROSS/BLUE SHIELD | Admitting: Family Medicine

## 2018-10-14 ENCOUNTER — Encounter: Payer: Self-pay | Admitting: Family Medicine

## 2018-10-14 ENCOUNTER — Other Ambulatory Visit: Payer: Self-pay

## 2018-10-14 DIAGNOSIS — R6889 Other general symptoms and signs: Secondary | ICD-10-CM | POA: Diagnosis not present

## 2018-10-14 DIAGNOSIS — R071 Chest pain on breathing: Secondary | ICD-10-CM | POA: Diagnosis not present

## 2018-10-14 DIAGNOSIS — Z20822 Contact with and (suspected) exposure to covid-19: Secondary | ICD-10-CM

## 2018-10-14 DIAGNOSIS — R0789 Other chest pain: Secondary | ICD-10-CM

## 2018-10-14 NOTE — Patient Instructions (Addendum)
You may return to work on Monday with precautions, note is written on your MyChart, let us know if you need help with that.  I recommend wearing the mask for 2 weeks.  Use tessalon perls if not improving we can send rx for a cough syrup, let me know.  You may try mucinex as well  Continue medications as recommended  Please schedule a Follow-up Appointment to: Return in about 1 week (around 10/21/2018), or if symptoms worsen or fail to improve, for cough, costochondritis.  If you have any other questions or concerns, please feel free to call the office or send a message through MyChart. You may also schedule an earlier appointment if necessary.  Additionally, you may be receiving a survey about your experience at our office within a few days to 1 week by e-mail or mail. We value your feedback.  Saralyn Pilar, DO [facility], [place]

## 2018-10-14 NOTE — Progress Notes (Signed)
Virtual Visit via Telephone The purpose of this virtual visit is to provide medical care while limiting exposure to the novel coronavirus (COVID19) for both patient and office staff.  Consent was obtained for phone visit:  Yes.   Answered questions that patient had about telehealth interaction:  Yes.   I discussed the limitations, risks, security and privacy concerns of performing an evaluation and management service by telephone. I also discussed with the patient that there may be a patient responsible charge related to this service. The patient expressed understanding and agreed to proceed.  Patient Location: Home Provider Location: [facility] (Office)  PCP is Cassell Smiles, AGPCNP-BC - I am currently covering during her maternity leave.   ---------------------------------------------------------------------- Chief Complaint  Patient presents with  . Hospitalization Follow-up    possible COVID-19, upper respiratory and chest wall pain. Pt states the pain symptoms have improved, but she still notice some chest pain as she is attempting to lay back     S: Reviewed CMA documentation. I have called patient and gathered additional HPI as follows:  ED FOLLOW-UP VISIT  Hospital/Location: [facility] Date of ED Visit: 10/05/18  Reason for Presenting to ED: cough, headache, chest wall pain Primary (+Secondary) Diagnosis: Suspected COVID19, possible costochondritis vs pleurisy chest wall pain  FOLLOW-UP  - ED provider note and record have been reviewed - Patient presents today about 9 days after recent ED visit. Brief summary of recent course, patient had symptoms of dry cough and chest wall pain, presented to ED on 10/05/18, testing in ED with lab work chemistry cbc and troponin unremarkable, had mild elevated CRP and ESR for inflammation, had EKG and Chest X-ray, treated with ibuprofen and lidocaine patch.  - Today reports overall has done fairly well after discharge from ED -  Symptoms of coughing have remained persistent, but somewhat improved, taking Tessalon Perls still - Describes chest wall pain is gradually improving, seems intermittent only, on Ibuprofen OTC 241m x 3 = 6042mPRN  Patient is currently out of work, and requesting clearance to return Denies any high risk travel to areas of current concern for COVID19. Denies any known or suspected exposure to person with or possibly with COVID19.  Denies any fevers, chills, sweats, body ache, shortness of breath, sinus pain or pressure, headache, abdominal pain, diarrhea  Past Medical History:  Diagnosis Date  . Hx of abnormal cervical Pap smear    Pt with several normal paps since abnormal pap  . Hypertension   . Irregular periods    history   Social History   Tobacco Use  . Smoking status: Former Smoker    Packs/day: 0.50    Years: 7.00    Pack years: 3.50    Last attempt to quit: 07/05/2014    Years since quitting: 4.2  . Smokeless tobacco: Never Used  Substance Use Topics  . Alcohol use: Yes    Comment: seldom  . Drug use: No    Current Outpatient Medications:  .  hydrochlorothiazide (HYDRODIURIL) 12.5 MG tablet, TAKE 1 TABLET BY MOUTH EVERY DAY (Patient taking differently: Take 12.5 mg by mouth daily. ), Disp: 90 tablet, Rfl: 2 .  benzonatate (TESSALON) 100 MG capsule, TK 1 TO 2 CS PO TID, Disp: , Rfl:   [facility] 07/29/2018 07/05/2017  Decreased Interest 0 0  Down, Depressed, Hopeless 0 0  PHQ - 2 Score 0 0    No flowsheet data found.  -------------------------------------------------------------------------- O: No physical exam performed due to  remote telephone encounter.   Recent Results (from the past 2160 hour(s))  POCT urine pregnancy     Status: Normal   Collection Time: 07/29/18 11:00 AM  Result Value Ref Range   Preg Test, Ur Negative Negative  TSH     Status: None   Collection Time: 07/29/18 11:07 AM  Result Value Ref Range   TSH 0.93 mIU/L     Comment:           Reference Range .           > or = 20 Years  0.40-4.50 .                Pregnancy Ranges           First trimester    0.26-2.66           Second trimester   0.55-2.73           Third trimester    0.43-2.91   Lipid panel     Status: Abnormal   Collection Time: 07/29/18 11:07 AM  Result Value Ref Range   Cholesterol 153 <200 mg/dL   HDL 41 (L) > OR = 50 mg/dL   Triglycerides 127 <150 mg/dL   LDL Cholesterol (Calc) 90 mg/dL (calc)    Comment: Reference range: <100 . Desirable range <100 mg/dL for primary prevention;   <70 mg/dL for patients with CHD or diabetic patients  with > or = 2 CHD risk factors. Marland Kitchen LDL-C is now calculated using the Martin-Hopkins  calculation, which is a validated novel method providing  better accuracy than the Friedewald equation in the  estimation of LDL-C.  Cresenciano Genre et al. Annamaria Helling. 6948;546(27): 2061-2068  (http://education.[facility].com/faq/FAQ164)    Total CHOL/HDL Ratio 3.7 <5.0 (calc)   Non-HDL Cholesterol (Calc) 112 <130 mg/dL (calc)    Comment: For patients with diabetes plus 1 major ASCVD risk  factor, treating to a non-HDL-C goal of <100 mg/dL  (LDL-C of <70 mg/dL) is considered a therapeutic  option.   COMPLETE METABOLIC PANEL WITH GFR     Status: None   Collection Time: 07/29/18 11:07 AM  Result Value Ref Range   Glucose, Bld 85 65 - 99 mg/dL    Comment: .            Fasting reference interval .    BUN 10 7 - 25 mg/dL   Creat 0.80 0.50 - 1.10 mg/dL   GFR, Est Non African American 100 > OR = 60 mL/min/1.47m   GFR, Est African American 115 > OR = 60 mL/min/1.721m  BUN/Creatinine Ratio NOT APPLICABLE 6 - 22 (calc)   Sodium 140 135 - 146 mmol/L   Potassium 3.7 3.5 - 5.3 mmol/L   Chloride 106 98 - 110 mmol/L   CO2 27 20 - 32 mmol/L   Calcium 9.0 8.6 - 10.2 mg/dL   Total Protein 6.3 6.1 - 8.1 g/dL   Albumin 3.7 3.6 - 5.1 g/dL   Globulin 2.6 1.9 - 3.7 g/dL (calc)   AG Ratio 1.4 1.0 - 2.5 (calc)   Total  Bilirubin 0.6 0.2 - 1.2 mg/dL   Alkaline phosphatase (APISO) 47 31 - 125 U/L   AST 15 10 - 30 U/L   ALT 23 6 - 29 U/L  Hemoglobin A1c     Status: None   Collection Time: 07/29/18 11:07 AM  Result Value Ref Range   Hgb A1c MFr Bld 5.4 <5.7 % of total Hgb    Comment: For  the purpose of screening for the presence of diabetes: . <5.7%       Consistent with the absence of diabetes 5.7-6.4%    Consistent with increased risk for diabetes             (prediabetes) > or =6.5%  Consistent with diabetes . This assay result is consistent with a decreased risk of diabetes. . Currently, no consensus exists regarding use of hemoglobin A1c for diagnosis of diabetes in children. . According to American Diabetes Association (ADA) guidelines, hemoglobin A1c <7.0% represents optimal control in non-pregnant diabetic patients. Different metrics may apply to specific patient populations.  Standards of Medical Care in Diabetes(ADA). .    Mean Plasma Glucose 108 (calc)   eAG (mmol/L) 6.0 (calc)  CBC with Differential/Platelet     Status: None   Collection Time: 07/29/18 11:07 AM  Result Value Ref Range   WBC 8.1 3.8 - 10.8 Thousand/uL   RBC 4.09 3.80 - 5.10 Million/uL   Hemoglobin 11.7 11.7 - 15.5 g/dL   HCT 35.1 35.0 - 45.0 %   MCV 85.8 80.0 - 100.0 fL   MCH 28.6 27.0 - 33.0 pg   MCHC 33.3 32.0 - 36.0 g/dL   RDW 14.6 11.0 - 15.0 %   Platelets 398 140 - 400 Thousand/uL   MPV 10.9 7.5 - 12.5 fL   Neutro Abs 5,265 1,500 - 7,800 cells/uL   Lymphs Abs 1,798 850 - 3,900 cells/uL   Absolute Monocytes 502 200 - 950 cells/uL   Eosinophils Absolute 486 15 - 500 cells/uL   Basophils Absolute 49 0 - 200 cells/uL   Neutrophils Relative % 65 %   Total Lymphocyte 22.2 %   Monocytes Relative 6.2 %   Eosinophils Relative 6.0 %   Basophils Relative 0.6 %  CBC with Differential     Status: Abnormal   Collection Time: 10/05/18  8:05 PM  Result Value Ref Range   WBC 9.6 4.0 - 10.5 K/uL   RBC 4.39 3.87 -  5.11 MIL/uL   Hemoglobin 12.2 12.0 - 15.0 g/dL   HCT 38.2 36.0 - 46.0 %   MCV 87.0 80.0 - 100.0 fL   MCH 27.8 26.0 - 34.0 pg   MCHC 31.9 30.0 - 36.0 g/dL   RDW 13.7 11.5 - 15.5 %   Platelets 403 (H) 150 - 400 K/uL   nRBC 0.0 0.0 - 0.2 %   Neutrophils Relative % 64 %   Neutro Abs 6.2 1.7 - 7.7 K/uL   Lymphocytes Relative 24 %   Lymphs Abs 2.3 0.7 - 4.0 K/uL   Monocytes Relative 7 %   Monocytes Absolute 0.6 0.1 - 1.0 K/uL   Eosinophils Relative 4 %   Eosinophils Absolute 0.4 0.0 - 0.5 K/uL   Basophils Relative 1 %   Basophils Absolute 0.1 0.0 - 0.1 K/uL   Immature Granulocytes 0 %   Abs Immature Granulocytes 0.02 0.00 - 0.07 K/uL    Comment: Performed at [facility], [address], [place], [place] [address]  Basic metabolic panel     Status: Abnormal   Collection Time: 10/05/18  8:05 PM  Result Value Ref Range   Sodium 138 135 - 145 mmol/L   Potassium 3.3 (L) 3.5 - 5.1 mmol/L   Chloride 101 98 - 111 mmol/L   CO2 26 22 - 32 mmol/L   Glucose, Bld 84 70 - 99 mg/dL   BUN 9 6 - 20 mg/dL   Creatinine, Ser 0.88 0.44 -  1.00 mg/dL   Calcium 9.1 8.9 - 10.3 mg/dL   GFR calc non Af Amer >60 >60 mL/min   GFR calc Af Amer >60 >60 mL/min   Anion gap 11 5 - 15    Comment: Performed at [facility] [address], [place], [place] [address]  Troponin I - ONCE - STAT     Status: None   Collection Time: 10/05/18  8:05 PM  Result Value Ref Range   Troponin I <0.03 <0.03 ng/mL    Comment: Performed at [facility], [place] [address], [place], [place] [address]  C-reactive protein     Status: Abnormal   Collection Time: 10/05/18  8:05 PM  Result Value Ref Range   CRP 1.7 (H) <1.0 mg/dL    Comment: Performed at [facility], [place] [address] [place], [place] [address]  Sedimentation rate     Status: Abnormal   Collection Time: 10/05/18  8:05 PM  Result Value Ref Range   Sed Rate 24 (H) 0 - 22 mm/hr    Comment: Performed at [facility] [address],  [place], [place] [address]    -------------------------------------------------------------------------- A&P:  Problem List Items Addressed This Visit    None    Visit Diagnoses    Suspected Covid-19 Virus Infection    -  Primary   Costochondral chest pain         Improving No confirmatory COVID19 testing, may consider future antibody IGg testing if needed Now afebrile >3-5 days, Cough improving Continue ibuprofen for costochdronitis Use tessalon perls for cough - we can add cough syrup or mucinex if need - call back Cleared to return to work on Monday 5/11 with mask restriction, wear mask for up to 2 weeks then based on employer recommendations  Follow-up criteria given if not improving or worsening or fever or dyspnea or return of symptoms  No orders of the defined types were placed in this encounter.   Patient verbalizes understanding with the above medical recommendations including the limitation of remote medical advice.  Specific follow-up and call-back criteria were given for patient to follow-up or seek medical care more urgently if needed.  - Time spent in direct consultation with patient on phone: 12 minutes  Nobie Putnam, [employer] 10/14/2018, 1:23 PM

## 2018-11-23 ENCOUNTER — Telehealth: Payer: Self-pay | Admitting: Family Medicine

## 2018-11-23 ENCOUNTER — Encounter: Payer: BLUE CROSS/BLUE SHIELD | Admitting: Certified Nurse Midwife

## 2018-11-23 DIAGNOSIS — N921 Excessive and frequent menstruation with irregular cycle: Secondary | ICD-10-CM

## 2018-11-23 MED ORDER — NORETHINDRONE 0.35 MG PO TABS: 1.0000 | ORAL_TABLET | Freq: Every day | ORAL | 2 refills | Status: DC

## 2018-11-23 NOTE — Telephone Encounter (Signed)
Patient has hx of PCOS and bleeding from past 5 days can't recall the name of med that helped her stopped bleeding from [place] as per patient ED provider has deleted the medication. As per Lauren's last note --may be start norethindrone 0.35 mg one tablet daily for cycle regulation and control of bleeding. Patient is asking for refill if possible she has appt with GYN but got cancelled from their end.

## 2018-11-23 NOTE — Telephone Encounter (Signed)
Reviewed Lauren's last note 07/2018, yes it seems she was on Norethindrone 0.35mg  daily for menorrhagia and it was also contraceptive, re ordered 30 day with refill, she should follow up with GYN when she can reschedule.  Nobie Putnam, [facility] 11/23/2018, 4:34 PM

## 2018-11-24 NOTE — Telephone Encounter (Signed)
The pt phone number is disconnected. I sent a mychart message to notify the patient.

## 2018-11-25 ENCOUNTER — Other Ambulatory Visit: Payer: Self-pay

## 2018-11-25 ENCOUNTER — Telehealth: Payer: Self-pay

## 2018-11-25 ENCOUNTER — Emergency Department
Admission: EM | Admit: 2018-11-25 | Discharge: 2018-11-25 | Disposition: A | Discharge status: Home / Self Care | Payer: BC Managed Care – PPO | Attending: Emergency Medicine | Admitting: Emergency Medicine

## 2018-11-25 ENCOUNTER — Ambulatory Visit: Payer: BC Managed Care – PPO | Admitting: Family Medicine

## 2018-11-25 ENCOUNTER — Encounter: Payer: Self-pay | Admitting: Emergency Medicine

## 2018-11-25 DIAGNOSIS — Z87891 Personal history of nicotine dependence: Secondary | ICD-10-CM | POA: Diagnosis not present

## 2018-11-25 DIAGNOSIS — Z79899 Other long term (current) drug therapy: Secondary | ICD-10-CM | POA: Diagnosis not present

## 2018-11-25 DIAGNOSIS — R519 Headache, unspecified: Secondary | ICD-10-CM

## 2018-11-25 DIAGNOSIS — R51 Headache: Secondary | ICD-10-CM | POA: Diagnosis not present

## 2018-11-25 DIAGNOSIS — I10 Essential (primary) hypertension: Secondary | ICD-10-CM | POA: Diagnosis not present

## 2018-11-25 LAB — BASIC METABOLIC PANEL
Anion gap: 7 (ref 5–15)
BUN: 7 mg/dL (ref 6–20)
CO2: 27 mmol/L (ref 22–32)
Calcium: 9 mg/dL (ref 8.9–10.3)
Chloride: 103 mmol/L (ref 98–111)
Creatinine, Ser: 0.82 mg/dL (ref 0.44–1.00)
GFR calc Af Amer: >60 mL/min (ref >60)
GFR calc non Af Amer: >60 mL/min (ref >60)
Glucose, Bld: 81 mg/dL (ref 70–99)
Potassium: 3 mmol/L — ABNORMAL LOW (ref 3.5–5.1)
Sodium: 137 mmol/L (ref 135–145)

## 2018-11-25 LAB — CBC
HCT: 33.1 % — ABNORMAL LOW (ref 36.0–46.0)
Hemoglobin: 10.6 g/dL — ABNORMAL LOW (ref 12.0–15.0)
MCH: 27.7 pg (ref 26.0–34.0)
MCHC: 32 g/dL (ref 30.0–36.0)
MCV: 86.4 fL (ref 80.0–100.0)
Platelets: 471 10*3/uL — ABNORMAL HIGH (ref 150–400)
RBC: 3.83 MIL/uL — ABNORMAL LOW (ref 3.87–5.11)
RDW: 15.5 % (ref 11.5–15.5)
WBC: 10.5 10*3/uL (ref 4.0–10.5)
nRBC: 0 % (ref 0.0–0.2)

## 2018-11-25 NOTE — ED Triage Notes (Signed)
Pt to ED via POV, pt states that she was at work and started to feel lightheaded and then she got a headache. Pt describes the headache as a "tightness". Pt states that she was sitting at her desk when the symptom started. Pt states that the back of her neck and both of her shoulders felt numb. Pt denies any other symptoms. Pt is in NAD.

## 2018-11-25 NOTE — Telephone Encounter (Signed)
Pt called to do prescreening number on file is disconnected.

## 2018-11-26 NOTE — ED Provider Notes (Signed)
[facility] Emergency Department Provider Note   ____________________________________________    I have reviewed the triage vital signs and the nursing notes.   HISTORY  Chief Complaint Headache     HPI [name] is a 28 y.o. female who reports while she was at work today she developed a bandlike headache which was moderate to severe nature and "pounding ".  Symptoms of mainly resolved by now.  At the time her headache was significant so she came to the emergency department for evaluation.  She denies neuro deficits.  No nausea or vomiting.  No history of severe headaches although does have "normal headaches ".  Past Medical History:  Diagnosis Date  . Hx of abnormal cervical Pap smear    Pt with several normal paps since abnormal pap  . Hypertension   . Irregular periods    history    Patient Active Problem List   Diagnosis Date Noted  . Hx of abnormal cervical Pap smear   . Essential hypertension 07/05/2017    Past Surgical History:  Procedure Laterality Date  . colposcopy      Prior to Admission medications   Medication Sig Start Date End Date Taking? Authorizing Provider  benzonatate (TESSALON) 100 MG capsule TK 1 TO 2 CS PO TID 09/30/18   [provider]  hydrochlorothiazide (HYDRODIURIL) 12.5 MG tablet TAKE 1 TABLET BY MOUTH EVERY DAY Patient taking differently: Take 12.5 mg by mouth daily.  08/29/18   Galen ManilaKennedy, Lauren Renee, NP  norethindrone (MICRONOR) 0.35 MG tablet Take 1 tablet (0.35 mg total) by mouth daily. 11/23/18   Smitty CordsKaramalegos, Alexander J, DO     Allergies Patient has no known allergies.  Family History  Problem Relation Age of Onset  . Hypertension Mother   . Stroke Mother        mini stroke?  Marland Kitchen. Thyroid disease Mother        thyroid removed  . Hypertension Sister     Social History Social History   Tobacco Use  . Smoking status: Former Smoker    Packs/day: 0.50    Years: 7.00    Pack years:  3.50    Quit date: 07/05/2014    Years since quitting: 4.3  . Smokeless tobacco: Never Used  Substance Use Topics  . Alcohol use: Yes    Comment: seldom  . Drug use: Yes    Types: Marijuana    Review of Systems  Constitutional: No fever/chills Eyes: No visual changes.  ENT: No sore throat. Cardiovascular: Denies chest pain. Respiratory: Denies shortness of breath. Gastrointestinal: No abdominal pain.  No nausea, no vomiting.   Genitourinary: Negative for dysuria. Musculoskeletal: Negative for back pain. Skin: Negative for rash. Neurological: As above   ____________________________________________   PHYSICAL EXAM:  VITAL SIGNS: ED Triage Vitals [11/25/18 1216]  Enc Vitals Group     BP (!) 145/91     Pulse Rate 93     Resp 16     Temp 99.2 F (37.3 C)     Temp Source Oral     SpO2 100 %     Weight 102.1 kg (225 lb)     Height 1.727 m (5\' 8" )     Head Circumference      Peak Flow      Pain Score 7     Pain Loc      Pain Edu?      Excl. in GC?     Constitutional: Alert and oriented.  No acute distress. Pleasant and interactive Eyes: Conjunctivae are normal.  PERRLA, EOMI Head: Atraumatic. Nose: No congestion/rhinnorhea. Mouth/Throat: Mucous membranes are moist.    Cardiovascular: Normal rate, regular rhythm. Grossly normal heart sounds.  Good peripheral circulation. Respiratory: Normal respiratory effort.  No retractions. Lungs CTAB. Gastrointestinal: Soft and nontender. No distention.  No CVA tenderness. Genitourinary: deferred Musculoskeletal: No lower extremity tenderness nor edema.  Warm and well perfused Neurologic:  Normal speech and language. No gross focal neurologic deficits are appreciated.  Cranial nerves II through XII are normal Skin:  Skin is warm, dry and intact. No rash noted. Psychiatric: Mood and affect are normal. Speech and behavior are normal.  ____________________________________________   LABS (all labs ordered are listed, but only  abnormal results are displayed)  Labs Reviewed  BASIC METABOLIC PANEL - Abnormal; Notable for the following components:      Result Value   Potassium 3.0 (*)    All other components within normal limits  CBC - Abnormal; Notable for the following components:   RBC 3.83 (*)    Hemoglobin 10.6 (*)    HCT 33.1 (*)    Platelets 471 (*)    All other components within normal limits   ____________________________________________  EKG  ED ECG REPORT I, Lavonia Drafts, the attending physician, personally viewed and interpreted this ECG.  Date: 11/26/2018  Rhythm: normal sinus rhythm QRS Axis: normal Intervals: normal ST/T Wave abnormalities: normal Narrative Interpretation: no evidence of acute ischemia  ____________________________________________  RADIOLOGY  CT scan pending ____________________________________________   PROCEDURES  Procedure(s) performed: No  Procedures   Critical Care performed: No ____________________________________________   INITIAL IMPRESSION / ASSESSMENT AND PLAN / ED COURSE  Pertinent labs & imaging results that were available during my care of the patient were reviewed by me and considered in my medical decision making (see chart for details).  Patient reports relatively abrupt onset of headache, she is feeling better now.  Given her description of abrupt moderate to severe headache recommend a CT scan despite the fact that patient is feeling improved.  She initially agreed to this however became impatient with the wait to obtain CT and opted to leave prior to obtaining imaging, hence leaving AMA.  She did have decisional capacity    ____________________________________________   FINAL CLINICAL IMPRESSION(S) / ED DIAGNOSES  Final diagnoses:  Acute nonintractable headache, unspecified headache type        Note:  This document was prepared using Dragon voice recognition software and may include unintentional dictation errors.    Lavonia Drafts, MD 11/26/18 [phone]

## 2018-11-28 ENCOUNTER — Encounter: Payer: BC Managed Care – PPO | Admitting: Certified Nurse Midwife

## 2019-01-03 DIAGNOSIS — Z20828 Contact with and (suspected) exposure to other viral communicable diseases: Secondary | ICD-10-CM | POA: Diagnosis not present

## 2019-07-12 DIAGNOSIS — R519 Headache, unspecified: Secondary | ICD-10-CM | POA: Diagnosis not present

## 2019-07-12 DIAGNOSIS — Z20822 Contact with and (suspected) exposure to covid-19: Secondary | ICD-10-CM | POA: Diagnosis not present

## 2019-07-12 DIAGNOSIS — J029 Acute pharyngitis, unspecified: Secondary | ICD-10-CM | POA: Diagnosis not present

## 2019-07-12 DIAGNOSIS — R05 Cough: Secondary | ICD-10-CM | POA: Diagnosis not present

## 2019-07-28 ENCOUNTER — Other Ambulatory Visit: Payer: Self-pay

## 2019-07-28 DIAGNOSIS — Z20828 Contact with and (suspected) exposure to other viral communicable diseases: Secondary | ICD-10-CM | POA: Diagnosis not present

## 2019-07-28 DIAGNOSIS — I10 Essential (primary) hypertension: Secondary | ICD-10-CM

## 2019-07-28 DIAGNOSIS — R0981 Nasal congestion: Secondary | ICD-10-CM | POA: Diagnosis not present

## 2019-07-28 DIAGNOSIS — Z760 Encounter for issue of repeat prescription: Secondary | ICD-10-CM | POA: Diagnosis not present

## 2019-07-28 DIAGNOSIS — R05 Cough: Secondary | ICD-10-CM | POA: Diagnosis not present

## 2019-07-28 NOTE — Telephone Encounter (Signed)
Can we schedule her for a follow up appointment and I can send in a refill once scheduled?  Thanks

## 2019-07-31 NOTE — Telephone Encounter (Signed)
Attempted to contact the pt, no answer. VM not set-up.  

## 2019-08-02 ENCOUNTER — Ambulatory Visit (INDEPENDENT_AMBULATORY_CARE_PROVIDER_SITE_OTHER): Payer: BC Managed Care – PPO | Admitting: Family Medicine

## 2019-08-02 ENCOUNTER — Encounter: Payer: Self-pay | Admitting: Family Medicine

## 2019-08-02 ENCOUNTER — Other Ambulatory Visit: Payer: Self-pay

## 2019-08-02 DIAGNOSIS — R197 Diarrhea, unspecified: Secondary | ICD-10-CM

## 2019-08-02 DIAGNOSIS — R05 Cough: Secondary | ICD-10-CM

## 2019-08-02 DIAGNOSIS — R059 Cough, unspecified: Secondary | ICD-10-CM

## 2019-08-02 DIAGNOSIS — I10 Essential (primary) hypertension: Secondary | ICD-10-CM

## 2019-08-02 DIAGNOSIS — R0989 Other specified symptoms and signs involving the circulatory and respiratory systems: Secondary | ICD-10-CM

## 2019-08-02 MED ORDER — HYDROCHLOROTHIAZIDE 12.5 MG PO TABS: 12.5000 mg | ORAL_TABLET | Freq: Every day | ORAL | 1 refills | Status: DC

## 2019-08-02 NOTE — Progress Notes (Signed)
Virtual Visit via Telephone The purpose of this virtual visit is to provide medical care while limiting exposure to the novel coronavirus (COVID19) for both patient and office staff.  Consent was obtained for phone visit:  Yes.   Answered questions that patient had about telehealth interaction:  Yes.   I discussed the limitations, risks, security and privacy concerns of performing an evaluation and management service by telephone. I also discussed with the patient that there may be a patient responsible charge related to this service. The patient expressed understanding and agreed to proceed.  Patient Location: Home Provider Location: [facility])  ---------------------------------------------------------------------- Chief Complaint  Patient presents with  . Hypertension    136/91 at Urgent Care on 07/28/19. Pt been off HCTZ  x 1 mth     S: Reviewed CMA documentation. I have called patient and gathered additional HPI as follows:  Patient has been off of her HCTZ x 1 month and within the last week has noticed that her blood pressure readings are ranging from 144-151 SBP, 96-103 DBP.  Denies any visual changes, headaches, dizziness, lightheadedness, chest pain, SOB, abdominal pain, n/v.  Has been having diarrhea x 1 per day for 1 week, a runny nose and a cough that is bringing up some mucus.  Had an exposure to her Mom on 07/15/19 who tested positive for COVID mid January 2021.  Reports her COVID test on 2/19 was negative with urgent care.  Denies fevers, sore throat, change in taste/smell, SOB, CP, abdominal pain, n/v.  Hasn't taken anything for her symptoms.  Patient is currently home Denies any high risk travel to areas of current concern for COVID19. Denies any known or suspected exposure to person with or possibly with COVID19.   Past Medical History:  Diagnosis Date  . Hx of abnormal cervical Pap smear    Pt with several normal paps since abnormal pap  .  Hypertension   . Irregular periods    history   Social History   Tobacco Use  . Smoking status: Former Smoker    Packs/day: 0.50    Years: 7.00    Pack years: 3.50    Quit date: 07/05/2014    Years since quitting: 5.0  . Smokeless tobacco: Never Used  Substance Use Topics  . Alcohol use: Yes    Comment: seldom  . Drug use: Yes    Types: Marijuana    Current Outpatient Medications:  .  hydrochlorothiazide (HYDRODIURIL) 12.5 MG tablet, Take 1 tablet (12.5 mg total) by mouth daily., Disp: 90 tablet, Rfl: 1 .  benzonatate (TESSALON) 100 MG capsule, TK 1 TO 2 CS PO TID, Disp: , Rfl:  .  norethindrone (MICRONOR) 0.35 MG tablet, Take 1 tablet (0.35 mg total) by mouth daily. (Patient not taking: Reported on 08/02/2019), Disp: 1 Package, Rfl: 2  Depression screen [facility] 2/9 08/02/2019 07/29/2018 07/05/2017  Decreased Interest 1 0 0  Down, Depressed, Hopeless 0 0 0  PHQ - 2 Score 1 0 0    No flowsheet data found.  -------------------------------------------------------------------------- O: No physical exam performed due to remote telephone encounter.  No results found for this or any previous visit (from the past 2160 hour(s)).  -------------------------------------------------------------------------- A&P:  Problem List Items Addressed This Visit      Cardiovascular and Mediastinum   Essential hypertension    Uncontrolled hypertension, has been off of her HCTZ x 1 month.  Had gone to urgent care on 2/19 where she reports her BP was 136/91,  had taken her BP the day before and was 151/103, and a few days before was 144/96.  States previously had been taking HCTZ 12.5mg  and tolerating well.  Denies headaches, visual changes, dizziness, lightheadedness, abdominal pain, n/v.  Plan: 1) Restart HCTZ 12.5mg  today and continue to take daily 2) Keep a blood pressure log and write down your readings daily and bring to your appointment 3) Contact us if your blood pressure is reading greater  than 130/80 for [place] to see you in clinic or adjust your medications telephonically 4)  We will plan to see you in clinic for your physical in 6 months, sooner if your blood pressure is reading greater than 130/80.      Relevant Medications   hydrochlorothiazide (HYDRODIURIL) 12.5 MG tablet     Other   Runny nose    Patient had gone to urgent care on 07/28/2019 and had a COVID PCR test completed, resulted as negative.  Was exposed to her Mom on 2/6 who was positive for COVID.  Has had one episode of diarrhea daily, a runny nose and had a dry cough that is now bringing up some clear mucus.  Denies fevers, sore throat, change in taste/smell, SOB, CP, abdominal pain, n/v, dizziness, lightheadedness.    Discussed with patient that sounds like she has post nasal drip along with the runny nose which can be collecting in the back of her throat and contributing to a productive cough with mucus.  Advised to try a BRAT diet to help with slowing down of GI tract and to increase water intake.  Can also try and take an over the counter allergy medication such as Claritin, Zyrtec or Allegra to help dry up some of her secretions.  If having continued symptoms to call the office back and we can look at an in office evaluation vs telephonic.      Cough    See runny nose A/P      Diarrhea    See runny nose A/P         Meds ordered this encounter  Medications  . hydrochlorothiazide (HYDRODIURIL) 12.5 MG tablet    Sig: Take 1 tablet (12.5 mg total) by mouth daily.    Dispense:  90 tablet    Refill:  1    Order Specific Question:   Supervising Provider    Answer:   Olin Hauser [[id number]]    Follow-up: - Return in 6 months for physical and HTN re-evaluation. - To call if blood pressure readings are greater than 130/80 after restarting HCTZ  Patient verbalizes understanding with the above medical recommendations including the limitation of remote medical advice.  Specific follow-up and  call-back criteria were given for patient to follow-up or seek medical care more urgently if needed.   - Time spent in direct consultation with patient on phone: 12 minutes  Harlin Rain, [facility] 08/02/2019, 1:53 PM

## 2019-08-02 NOTE — Assessment & Plan Note (Signed)
See runny nose A/P

## 2019-08-02 NOTE — Assessment & Plan Note (Signed)
Patient had gone to urgent care on 07/28/2019 and had a COVID PCR test completed, resulted as negative.  Was exposed to her Mom on 2/6 who was positive for COVID.  Has had one episode of diarrhea daily, a runny nose and had a dry cough that is now bringing up some clear mucus.  Denies fevers, sore throat, change in taste/smell, SOB, CP, abdominal pain, n/v, dizziness, lightheadedness.    Discussed with patient that sounds like she has post nasal drip along with the runny nose which can be collecting in the back of her throat and contributing to a productive cough with mucus.  Advised to try a BRAT diet to help with slowing down of GI tract and to increase water intake.  Can also try and take an over the counter allergy medication such as Claritin, Zyrtec or Allegra to help dry up some of her secretions.  If having continued symptoms to call the office back and we can look at an in office evaluation vs telephonic.

## 2019-08-02 NOTE — Assessment & Plan Note (Signed)
Uncontrolled hypertension, has been off of her HCTZ x 1 month.  Had gone to urgent care on 2/19 where she reports her BP was 136/91, had taken her BP the day before and was 151/103, and a few days before was 144/96.  States previously had been taking HCTZ 12.5mg  and tolerating well.  Denies headaches, visual changes, dizziness, lightheadedness, abdominal pain, n/v.  Plan: 1) Restart HCTZ 12.5mg  today and continue to take daily 2) Keep a blood pressure log and write down your readings daily and bring to your appointment 3) Contact us if your blood pressure is reading greater than 130/80 for [place] to see you in clinic or adjust your medications telephonically 4)  We will plan to see you in clinic for your physical in 6 months, sooner if your blood pressure is reading greater than 130/80.

## 2019-08-02 NOTE — Patient Instructions (Signed)
DIARRHEA  Stool is typically made up of three-quarters water and one-quarter solid matter. With diarrhea the water content is obviously higher. Diarrhea is a symptom. Determining the underlying cause is key for finding the right solutions to rectifying the problem. Most diarrhea resolves itself when the underlying cause is addressed. The loose stools are a way that the body can expel something it doesn't need or is otherwise toxic.   Diarrhea treatment -Drink plenty of fluids to avoid dehydration -Avoid dairy, fat, sugar and excess meat   -For acute diarrhea: let it run its course and stay well hydrated  -Consider bone or vegetable broth  -BRAT diet: banana, rice, apples, toast   Supplemental Treatment to Consider for Diarrhea:   -Probiotics -Increasing amounts of ground flaxseed  -Psyllium husk  -Fiber supplement  Try to get exercise a minimum of 30 minutes per day at least 5 days per week as well as  adequate water intake all while measuring blood pressure a few times per week.  Keep a blood pressure log and bring back to clinic at your next visit.  If your readings are consistently over 140/90 to contact our office/send me a MyChart message and we will see you sooner.  Can try DASH and Mediterranean diet options, avoiding processed foods, lowering sodium intake, avoiding pork products, and eating a plant based diet for optimal health.  Education and discussion with patient regarding hypertension as well as the effects on the organs and body.  Specifically, we spoke about kidney disease, kidney failure, heart attack and store as likely outcomes if non-compliant with blood pressure regulation.  Discussed how all of these habits are attached to each other and each has the effect on each other.  Patient verbalized understanding and agreement with care plan.  Questions were answered to the patient's satisfaction.  You will receive a survey after today's visit either digitally by e-mail or  paper by [employer]. Your experiences and feedback matter to [place].  Please respond so we know how we are doing as we provide care for you.  Call us with any questions/concerns/needs.  It is my goal to be available to you for your health concerns.  Thanks for choosing me to be a partner in your healthcare needs!  Charlaine Dalton, FNP-C Family Nurse Practitioner [facility] Phone: [phone]

## 2019-08-02 NOTE — Progress Notes (Signed)
I have reviewed this encounter including the documentation in this note and/or discussed this patient with the provider, Nicole [name] FNP. I am certifying that I agree with the content of this note as supervising physician.  Tajuanna Burnett, DO [facility] 08/02/2019, 6:06 PM  

## 2019-08-02 NOTE — Assessment & Plan Note (Signed)
See runny nose A/P 

## 2019-11-23 IMAGING — US ULTRASOUND RIGHT BREAST LIMITED
1 series · 6 of 6 positions shown · non-contrast
Comparison: None.

CLINICAL DATA: Palpable nodule within the intramammary fold of the
right breast, felt by the patient several months ago.

EXAM:
ULTRASOUND OF THE RIGHT BREAST

[Series 1: ultrasound right breast limited · 0.04mm/px · 6 of 6 slices shown]
[im 1/6]
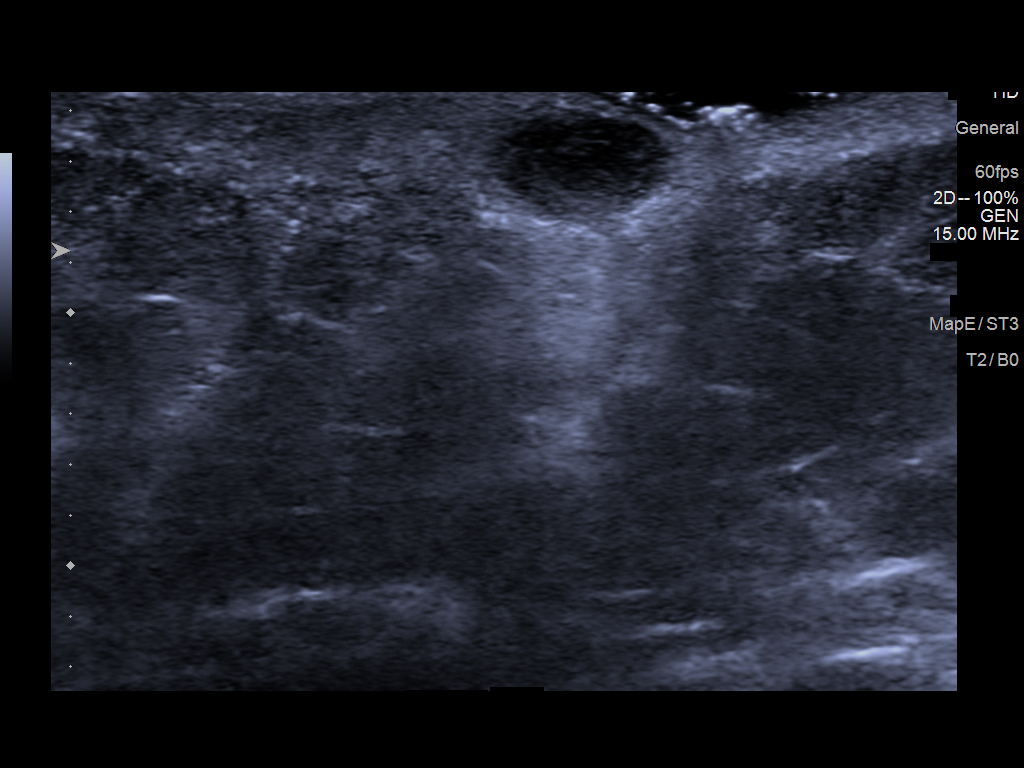
[im 2/6]
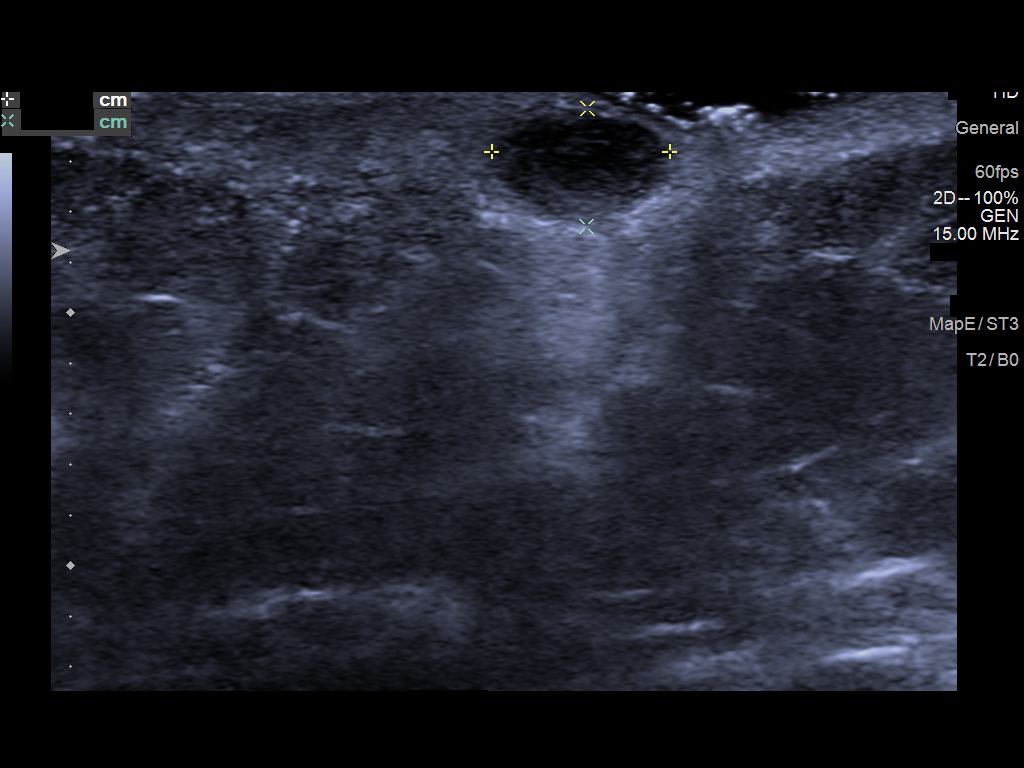
[im 3/6]
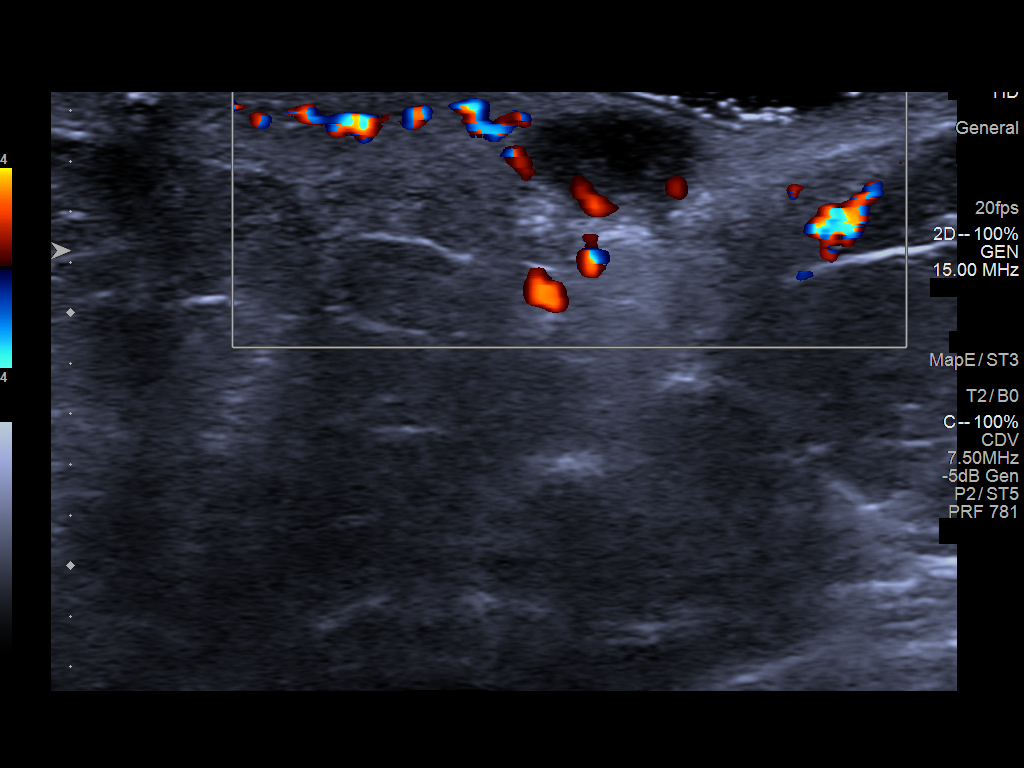
[im 4/6]
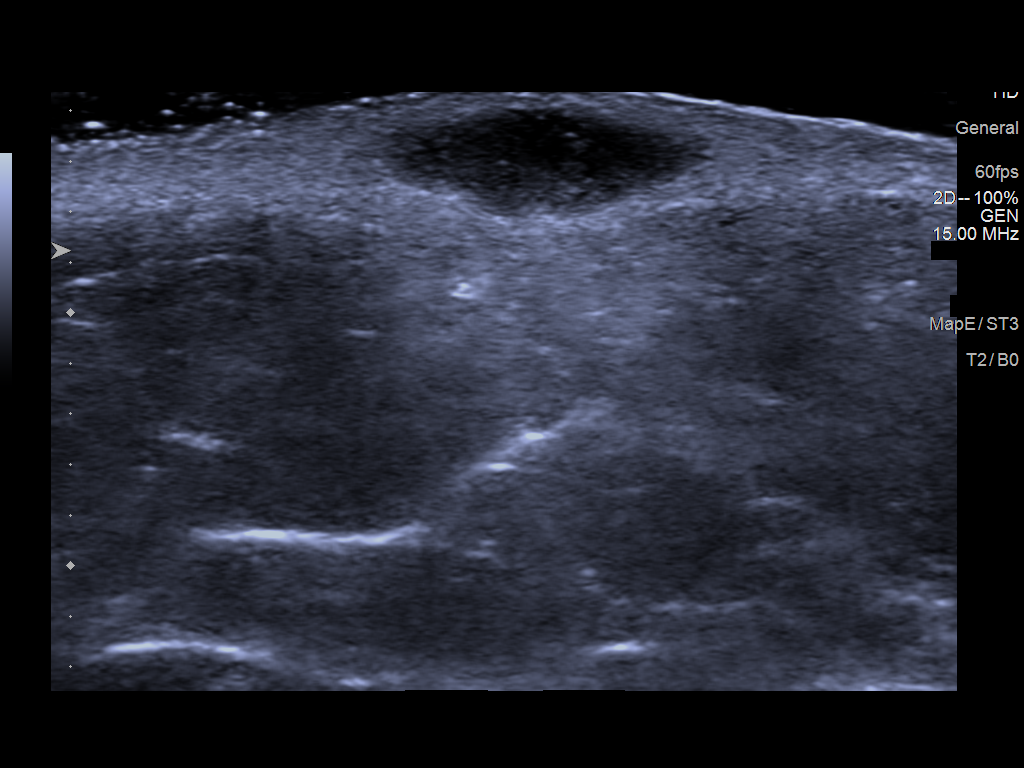
[im 5/6]
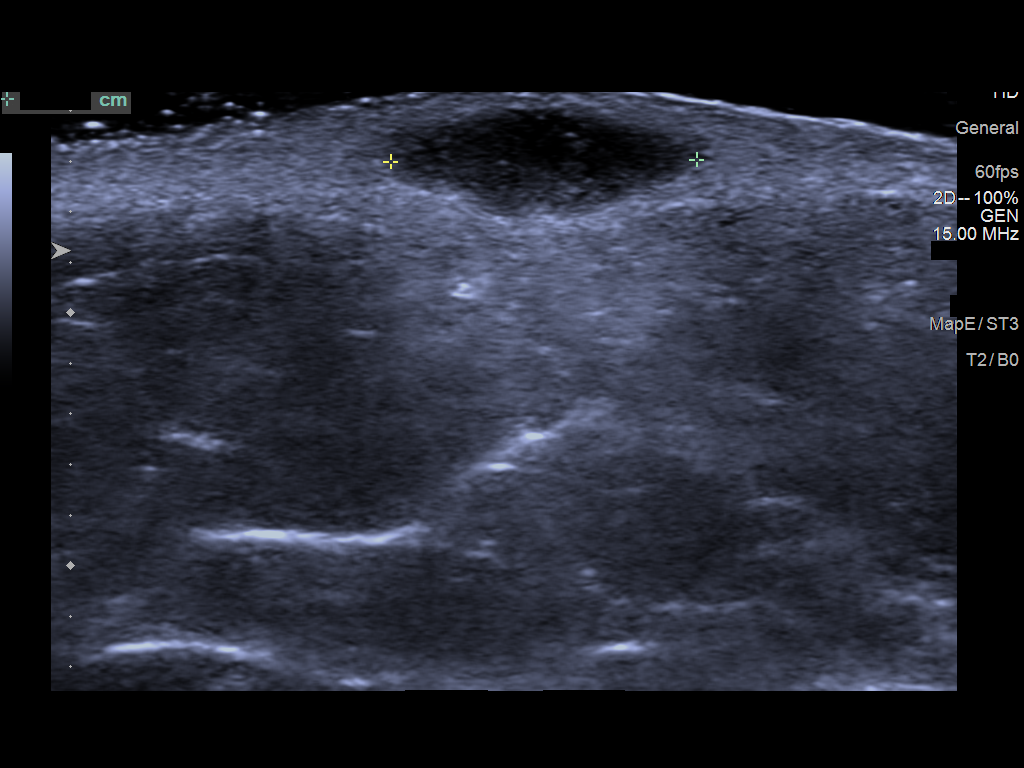
[im 6/6]
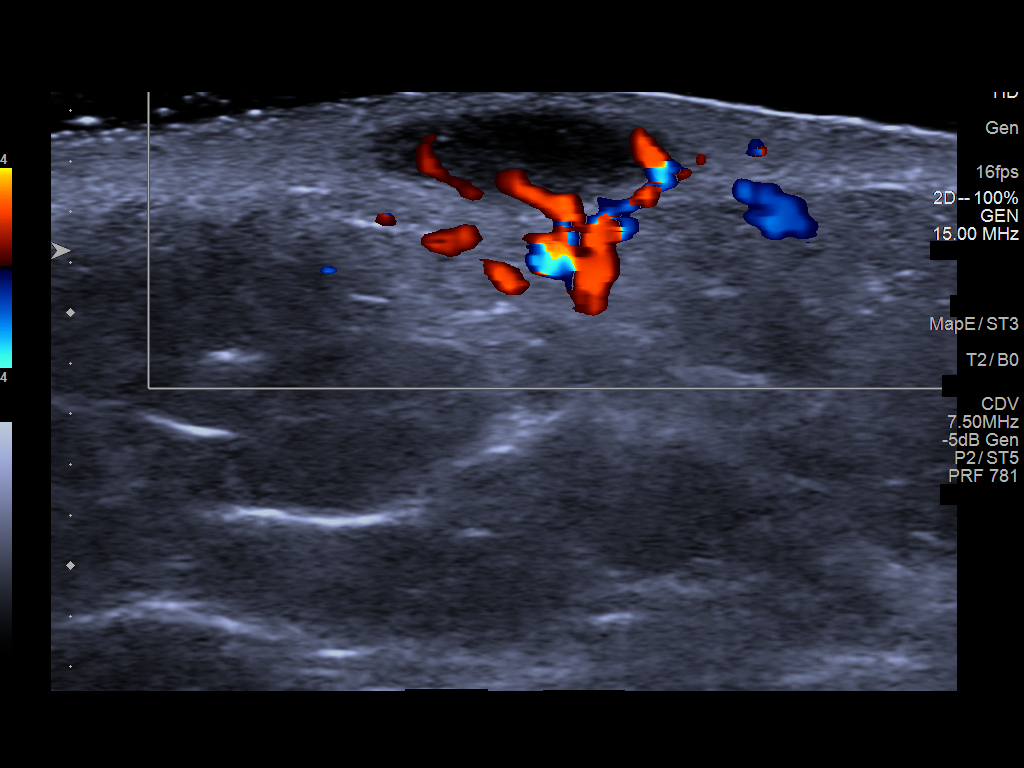

[6 of 6 positions shown; findings below may reference images not displayed]

FINDINGS: On physical exam, there is a superficially located elongated
moderately firm nodule in the right intramammary fold.

Targeted ultrasound is performed, showing intradermal hypoechoic
phlegmon collection measuring 0.7 x 0.5 x 1.2 cm. This finding
corresponds to the palpable nodule described by the patient. The
underlying breast parenchyma is normal.
IMPRESSION: Palpable right breast skin nodule, within the intramammary fold,
likely representing an epidermal inclusion cyst, for which clinical
follow-up is recommended.

RECOMMENDATION:
Screening mammogram at age 40 unless there are persistent or
intervening clinical concerns. (Code:[id number]-Z-[id number])

I have discussed the findings and recommendations with the patient.
Results were also provided in writing at the conclusion of the
visit. If applicable, a reminder letter will be sent to the patient
regarding the next appointment.

BI-RADS CATEGORY  2: Benign.

## 2020-03-10 ENCOUNTER — Other Ambulatory Visit: Payer: Self-pay | Admitting: Family Medicine

## 2020-03-10 DIAGNOSIS — I10 Essential (primary) hypertension: Secondary | ICD-10-CM

## 2020-03-10 NOTE — Telephone Encounter (Signed)
Requested medications are due for refill today?  Yes  Requested medications are on active medication list?  Yes  Last Refill:  08/02/2019  # 90 with one refill   Future visit scheduled?  No   Notes to Clinic:  Medication failed Rx refill protocol due to no valid encounter in the past 6 months and no labs within the past 360 days. Patient's last office visit was 7 months ago and the last labs were performed on 11/25/2018.

## 2020-03-26 ENCOUNTER — Other Ambulatory Visit: Payer: Self-pay | Admitting: Family Medicine

## 2020-03-26 DIAGNOSIS — I10 Essential (primary) hypertension: Secondary | ICD-10-CM

## 2020-03-26 NOTE — Telephone Encounter (Signed)
Patient has appointment tomorrow- Rx refilled per protocol

## 2020-03-27 ENCOUNTER — Other Ambulatory Visit (HOSPITAL_COMMUNITY)
Admission: RE | Admit: 2020-03-27 | Discharge: 2020-03-27 | Disposition: A | Discharge status: Home / Self Care | Payer: Self-pay | Source: Ambulatory Visit | Attending: Family Medicine | Admitting: Family Medicine

## 2020-03-27 ENCOUNTER — Ambulatory Visit (INDEPENDENT_AMBULATORY_CARE_PROVIDER_SITE_OTHER): Payer: Self-pay | Admitting: Family Medicine

## 2020-03-27 ENCOUNTER — Other Ambulatory Visit: Payer: Self-pay

## 2020-03-27 ENCOUNTER — Encounter: Payer: Self-pay | Admitting: Family Medicine

## 2020-03-27 VITALS — BP 118/77 | HR 87 | Temp 99.8°F | Resp 18 | Ht 68.0 in | Wt 220.2 lb

## 2020-03-27 DIAGNOSIS — Z13220 Encounter for screening for lipoid disorders: Secondary | ICD-10-CM | POA: Insufficient documentation

## 2020-03-27 DIAGNOSIS — Z0000 Encounter for general adult medical examination without abnormal findings: Secondary | ICD-10-CM | POA: Diagnosis not present

## 2020-03-27 DIAGNOSIS — Z1329 Encounter for screening for other suspected endocrine disorder: Secondary | ICD-10-CM

## 2020-03-27 DIAGNOSIS — I10 Essential (primary) hypertension: Secondary | ICD-10-CM

## 2020-03-27 DIAGNOSIS — Z124 Encounter for screening for malignant neoplasm of cervix: Secondary | ICD-10-CM | POA: Insufficient documentation

## 2020-03-27 MED ORDER — HYDROCHLOROTHIAZIDE 12.5 MG PO TABS: ORAL_TABLET | ORAL | 3 refills | Status: DC

## 2020-03-27 NOTE — Assessment & Plan Note (Signed)
Controlled hypertension.  BP is at goal < 130/80.  Pt is working on lifestyle modifications.  Taking medications tolerating well without side effects.  Complications: Obesity  Plan: 1. Continue taking hydrochlorothiazide 12.5mg  daily 2. Obtain labs today  3. Encouraged heart healthy diet and increasing exercise to 30 minutes most days of the week, going no more than 2 days in a row without exercise. 4. Check BP 1-2 x per week at home, keep log, and bring to clinic at next appointment. 5. Follow up 6 months.

## 2020-03-27 NOTE — Patient Instructions (Signed)
Have your labs drawn and we will contact you with the results.  We have sent your PAP to the lab for testing.  Will contact you once the results are received.  Well Visit: Care Instructions Overview  Well visits can help you stay healthy. Your provider has checked your overall health and may have suggested ways to take good care of yourself. Your provider also may have recommended tests. At home, you can help prevent illness with healthy eating, regular exercise, and other steps.  Follow-up care is a key part of your treatment and safety. Be sure to make and go to all appointments, and call your provider if you are having problems. It's also a good idea to know your test results and keep a list of the medicines you take.  How can you care for yourself at home?   Get screening tests that you and your doctor decide on. Screening helps find diseases before any symptoms appear.   Eat healthy foods. Choose fruits, vegetables, whole grains, protein, and low-fat dairy foods. Limit fat, especially saturated fat. Reduce salt in your diet.   Limit alcohol. If you are a man, have no more than 2 drinks a day or 14 drinks a week. If you are a woman, have no more than 1 drink a day or 7 drinks a week.   Get at least 30 minutes of physical activity on most days of the week.  We recommend you go no more than 2 days in a row without exercise. Walking is a good choice. You also may want to do other activities, such as running, swimming, cycling, or playing tennis or team sports. Discuss any changes in your exercise program with your provider.   Reach and stay at a healthy weight. This will lower your risk for many problems, such as obesity, diabetes, heart disease, and high blood pressure.   Do not smoke or allow others to smoke around you. If you need help quitting, talk to your provider about stop-smoking programs and medicines. These can increase your chances of quitting for good.  Can call  1-800-QUIT-NOW (873 161 0629) for the [facility], assistance with smoking cessation.   Care for your mental health. It is easy to get weighed down by worry and stress. Learn strategies to manage stress, like deep breathing and mindfulness, and stay connected with your family and community. If you find you often feel sad or hopeless, talk with your provider. Treatment can help.   Talk to your provider about whether you have any risk factors for sexually transmitted infections (STIs). You can help prevent STIs if you wait to have sex with a new partner (or partners) until you've each been tested for STIs. It also helps if you use condoms (female or female condoms) and if you limit your sex partners to one person who only has sex with you. Vaccines are available for some STIs, such as HPV (these are age dependent).   Use birth control if it's important to you to prevent pregnancy. Talk with your provider about the choices available and what might be best for you.   If you think you may have a problem with alcohol or drug use, talk to your provider. This includes prescription medicines (such as amphetamines and opioids) and illegal drugs (such as cocaine and methamphetamine). Your provider can help you figure out what type of treatment is best for you.   If you have concerns about domestic violence or intimate partner violence, there are resources  available to you. National Domestic Abuse Hotline (971) 530-3954   Protect your skin from too much sun. When you're outdoors from 10 a.m. to 4 p.m., stay in the shade or cover up with clothing and a hat with a wide brim. Wear sunglasses that block UV rays. Even when it's cloudy, put broad-spectrum sunscreen (SPF 30 or higher) on any exposed skin.   See a dentist one or two times a year for checkups and to have your teeth cleaned.   See an eye doctor once per year for an eye exam.   Wear a seat belt in the car.  When should you call for  help?  Watch closely for changes in your health, and be sure to contact your provider if you have any problems or symptoms that concern you.  We will plan to see you back in 6 months for hypertension follow up visit  You will receive a survey after today's visit either digitally by e-mail or paper by [employer]PS mail. Your experiences and feedback matter to [place].  Please respond so we know how we are doing as we provide care for you.  Call us with any questions/concerns/needs.  It is my goal to be available to you for your health concerns.  Thanks for choosing me to be a partner in your healthcare needs!  Charlaine Dalton, FNP-C Family Nurse Practitioner [facility] Phone: [phone]

## 2020-03-27 NOTE — Assessment & Plan Note (Signed)
Last PAP testing 04/06/2017.  Result NIL and without HPV testing.  Due for PAP testing.  Plan: 1. PAP testing completed and sent to lab for evaluation

## 2020-03-27 NOTE — Assessment & Plan Note (Signed)
Annual physical exam without new findings.  Well adult with no acute concerns.  Plan: 1. Obtain health maintenance screenings as above according to age. - Increase physical activity to 30 minutes most days of the week.  - Eat healthy diet high in vegetables and fruits; low in refined carbohydrates. - Screening labs and tests as ordered 2. Return 1 year for annual physical.  

## 2020-03-27 NOTE — Progress Notes (Signed)
Subjective:    Patient ID: [name], female    DOB: March 16, 1991, 29 y.o.   MRN: [id number]  Laura GlasgowSharayne Hazel is a 29 y.o. female presenting on 03/27/2020 for Annual Exam   HPI  HEALTH MAINTENANCE:  Weight/BMI: Obese, BMI 33.48% Physical activity: Sedentary Diet: Regular Seatbelt: Yes Sunscreen: Does not wear, encouraged PAP: Due, completed today HIV & Hep C Screening: Offered and declined  GC/CT: Offered and declined  Optometry: Regularly Dentistry: Scheduling an appointment  IMMUNIZATIONS: Tetanus: Due, Discussed Influenza: Due, Discussed COVID: Discussed  [facility] 08/02/2019 07/29/2018 07/05/2017  Decreased Interest 1 0 0  Down, Depressed, Hopeless 0 0 0  PHQ - 2 Score 1 0 0    Past Medical History:  Diagnosis Date  . Hx of abnormal cervical Pap smear    Pt with several normal paps since abnormal pap  . Hypertension   . Irregular periods    history   Past Surgical History:  Procedure Laterality Date  . colposcopy     Social History   Socioeconomic History  . Marital status: Single    Spouse name: Not on file  . Number of children: Not on file  . Years of education: Not on file  . Highest education level: Not on file  Occupational History  . Not on file  Tobacco Use  . Smoking status: Former Smoker    Packs/day: 0.50    Years: 7.00    Pack years: 3.50    Quit date: 07/05/2014    Years since quitting: 5.7  . Smokeless tobacco: Never Used  Vaping Use  . Vaping Use: Never used  Substance and Sexual Activity  . Alcohol use: Yes    Comment: seldom  . Drug use: Yes    Types: Marijuana  . Sexual activity: Yes    Partners: Male    Birth control/protection: None  Other Topics Concern  . Not on file  Social History Narrative  . Not on file   Social Determinants of Health   Financial Resource Strain:   . Difficulty of Paying Living Expenses: Not on file  Food Insecurity:   . Worried About Programme researcher, broadcasting/film/videounning Out of Food in the Last  Year: Not on file  . Ran Out of Food in the Last Year: Not on file  Transportation Needs:   . Lack of Transportation (Medical): Not on file  . Lack of Transportation (Non-Medical): Not on file  Physical Activity:   . Days of Exercise per Week: Not on file  . Minutes of Exercise per Session: Not on file  Stress:   . Feeling of Stress : Not on file  Social Connections:   . Frequency of Communication with Friends and Family: Not on file  . Frequency of Social Gatherings with Friends and Family: Not on file  . Attends Religious Services: Not on file  . Active Member of Clubs or Organizations: Not on file  . Attends BankerClub or Organization Meetings: Not on file  . Marital Status: Not on file  Intimate Partner Violence:   . Fear of Current or Ex-Partner: Not on file  . Emotionally Abused: Not on file  . Physically Abused: Not on file  . Sexually Abused: Not on file   Family History  Problem Relation Age of Onset  . Hypertension Mother   . Stroke Mother        mini stroke?  Marland Kitchen. Thyroid disease Mother        thyroid removed  . Hypertension Sister  Current Outpatient Medications on File Prior to Visit  Medication Sig  . norethindrone (MICRONOR) 0.35 MG tablet Take 1 tablet (0.35 mg total) by mouth daily. (Patient not taking: Reported on 08/02/2019)   No current facility-administered medications on file prior to visit.    Per HPI unless specifically indicated above     Objective:    BP 118/77 (BP Location: Right Arm, Patient Position: Sitting, Cuff Size: Normal)   Pulse 87   Temp 99.8 F (37.7 C) (Oral)   Resp 18   Ht 5\' 8"  (1.727 m)   Wt 220 lb 3.2 oz (99.9 kg)   LMP 02/24/2020   SpO2 100%   BMI 33.48 kg/m   Wt Readings from Last 3 Encounters:  03/27/20 220 lb 3.2 oz (99.9 kg)  11/25/18 225 lb (102.1 kg)  10/05/18 225 lb (102.1 kg)    Physical Exam Vitals and nursing note reviewed.  Constitutional:      General: She is not in acute distress.    Appearance: Normal  appearance. She is well-developed and well-groomed. She is obese. She is not ill-appearing or toxic-appearing.  HENT:     Head: Normocephalic and atraumatic.     Right Ear: Tympanic membrane, ear canal and external ear normal. There is no impacted cerumen.     Left Ear: Tympanic membrane, ear canal and external ear normal. There is no impacted cerumen.     Nose: Nose normal. No congestion or rhinorrhea.     Comments: 10/07/18 is in place, covering mouth and nose.    Mouth/Throat:     Lips: Pink.     Mouth: Mucous membranes are moist.     Pharynx: Oropharynx is clear. Uvula midline. No oropharyngeal exudate or posterior oropharyngeal erythema.  Eyes:     General: Lids are normal. Vision grossly intact. No scleral icterus.       Right eye: No discharge.        Left eye: No discharge.     Extraocular Movements: Extraocular movements intact.     Conjunctiva/sclera: Conjunctivae normal.     Pupils: Pupils are equal, round, and reactive to light.  Neck:     Thyroid: No thyroid mass or thyromegaly.  Cardiovascular:     Rate and Rhythm: Normal rate and regular rhythm.     Pulses: Normal pulses.          Dorsalis pedis pulses are 2+ on the right side and 2+ on the left side.     Heart sounds: Normal heart sounds. No murmur heard.  No friction rub. No gallop.   Pulmonary:     Effort: Pulmonary effort is normal. No respiratory distress.     Breath sounds: Normal breath sounds.  Chest:     Comments: Deferred Abdominal:     General: Abdomen is flat. Bowel sounds are normal. There is no distension.     Palpations: Abdomen is soft. There is no hepatomegaly, splenomegaly or mass.     Tenderness: There is no abdominal tenderness. There is no guarding or rebound.     Hernia: No hernia is present. There is no hernia in the left inguinal area or right inguinal area.  Genitourinary:    General: Normal vulva.     Exam position: Lithotomy position.     Pubic Area: No rash or pubic lice.      Labia:         Right: No rash, tenderness, lesion or injury.        Left: No rash,  tenderness, lesion or injury.      Urethra: No urethral pain, urethral swelling or urethral lesion.     Vagina: Normal.     Cervix: Normal.     Uterus: Normal.      Adnexa: Right adnexa normal and left adnexa normal.       Comments: Left labia with large skin tag Musculoskeletal:        General: Normal range of motion.     Cervical back: Normal range of motion and neck supple. No tenderness.     Right lower leg: No edema.     Left lower leg: No edema.     Comments: Normal tone, strength 5/5 BUE & BLE  Feet:     Right foot:     Skin integrity: Skin integrity normal.     Left foot:     Skin integrity: Skin integrity normal.  Lymphadenopathy:     Cervical: No cervical adenopathy.     Lower Body: No right inguinal adenopathy. No left inguinal adenopathy.  Skin:    General: Skin is warm and dry.     Capillary Refill: Capillary refill takes less than 2 seconds.  Neurological:     General: No focal deficit present.     Mental Status: She is alert and oriented to person, place, and time.     Cranial Nerves: No cranial nerve deficit.     Sensory: No sensory deficit.     Motor: No weakness.     Coordination: Coordination normal.     Gait: Gait normal.     Deep Tendon Reflexes: Reflexes normal.  Psychiatric:        Attention and Perception: Attention and perception normal.        Mood and Affect: Mood and affect normal.        Speech: Speech normal.        Behavior: Behavior normal. Behavior is cooperative.        Thought Content: Thought content normal.        Cognition and Memory: Cognition and memory normal.        Judgment: Judgment normal.     Results for orders placed or performed during the hospital encounter of 11/25/18  Basic metabolic panel  Result Value Ref Range   Sodium 137 135 - 145 mmol/L   Potassium 3.0 (L) 3.5 - 5.1 mmol/L   Chloride 103 98 - 111 mmol/L   CO2 27 22 - 32 mmol/L    Glucose, Bld 81 70 - 99 mg/dL   BUN 7 6 - 20 mg/dL   Creatinine, Ser 1.47 0.44 - 1.00 mg/dL   Calcium 9.0 8.9 - 82.9 mg/dL   GFR calc non Af Amer >60 >60 mL/min   GFR calc Af Amer >60 >60 mL/min   Anion gap 7 5 - 15  CBC  Result Value Ref Range   WBC 10.5 4.0 - 10.5 K/uL   RBC 3.83 (L) 3.87 - 5.11 MIL/uL   Hemoglobin 10.6 (L) 12.0 - 15.0 g/dL   HCT 56.2 (L) 36 - 46 %   MCV 86.4 80.0 - 100.0 fL   MCH 27.7 26.0 - 34.0 pg   MCHC 32.0 30.0 - 36.0 g/dL   RDW 13.0 86.5 - 78.4 %   Platelets 471 (H) 150 - 400 K/uL   nRBC 0.0 0.0 - 0.2 %      Assessment & Plan:   Problem List Items Addressed This Visit      Cardiovascular and Mediastinum  Essential hypertension    Controlled hypertension.  BP is at goal < 130/80.  Pt is working on lifestyle modifications.  Taking medications tolerating well without side effects.  Complications: Obesity  Plan: 1. Continue taking hydrochlorothiazide 12.5mg  daily 2. Obtain labs today  3. Encouraged heart healthy diet and increasing exercise to 30 minutes most days of the week, going no more than 2 days in a row without exercise. 4. Check BP 1-2 x per week at home, keep log, and bring to clinic at next appointment. 5. Follow up 6 months.       Relevant Medications   hydrochlorothiazide (HYDRODIURIL) 12.5 MG tablet   Other Relevant Orders   CBC with Differential   COMPLETE METABOLIC PANEL WITH GFR     Other   Annual physical exam - Primary    Annual physical exam without new findings.  Well adult with no acute concerns.  Plan: 1. Obtain health maintenance screenings as above according to age. - Increase physical activity to 30 minutes most days of the week.  - Eat healthy diet high in vegetables and fruits; low in refined carbohydrates. - Screening labs and tests as ordered 2. Return 1 year for annual physical.       Relevant Orders   POCT Urinalysis Dipstick   CBC with Differential   COMPLETE METABOLIC PANEL WITH GFR   Screening,  lipid   Relevant Orders   Lipid Profile   Screening for cervical cancer    Last PAP testing 04/06/2017.  Result NIL and without HPV testing.  Due for PAP testing.  Plan: 1. PAP testing completed and sent to lab for evaluation       Relevant Orders   Cytology - PAP   Screening for thyroid disorder   Relevant Orders   TSH + free T4      Meds ordered this encounter  Medications  . hydrochlorothiazide (HYDRODIURIL) 12.5 MG tablet    Sig: TAKE 1 TABLET(12.5 MG) BY MOUTH DAILY    Dispense:  90 tablet    Refill:  3    Follow up plan: Return in about 6 months (around 09/25/2020) for HTN F/U .  Charlaine Dalton, FNP-C Family Nurse Practitioner [facility] 03/27/2020, 10:22 AM

## 2020-03-28 LAB — CBC WITH DIFFERENTIAL/PLATELET
Absolute Monocytes: 496 cells/uL (ref 200–950)
Basophils Absolute: 80 cells/uL (ref 0–200)
Basophils Relative: 1 %
Eosinophils Absolute: 352 cells/uL (ref 15–500)
Eosinophils Relative: 4.4 %
HCT: 37.1 % (ref 35.0–45.0)
Hemoglobin: 11.7 g/dL (ref 11.7–15.5)
Lymphs Abs: 1808 cells/uL (ref 850–3900)
MCH: 27.1 pg (ref 27.0–33.0)
MCHC: 31.5 g/dL — ABNORMAL LOW (ref 32.0–36.0)
MCV: 86.1 fL (ref 80.0–100.0)
MPV: 10 fL (ref 7.5–12.5)
Monocytes Relative: 6.2 %
Neutro Abs: 5264 cells/uL (ref 1500–7800)
Neutrophils Relative %: 65.8 %
Platelets: 392 10*3/uL (ref 140–400)
RBC: 4.31 10*6/uL (ref 3.80–5.10)
RDW: 15.4 % — ABNORMAL HIGH (ref 11.0–15.0)
Total Lymphocyte: 22.6 %
WBC: 8 10*3/uL (ref 3.8–10.8)

## 2020-03-28 LAB — COMPLETE METABOLIC PANEL WITH GFR
AG Ratio: 1.5 (calc) (ref 1.0–2.5)
ALT: 13 U/L (ref 6–29)
AST: 11 U/L (ref 10–30)
Albumin: 3.7 g/dL (ref 3.6–5.1)
Alkaline phosphatase (APISO): 38 U/L (ref 31–125)
BUN: 9 mg/dL (ref 7–25)
CO2: 21 mmol/L (ref 20–32)
Calcium: 8.9 mg/dL (ref 8.6–10.2)
Chloride: 112 mmol/L — ABNORMAL HIGH (ref 98–110)
Creat: 0.77 mg/dL (ref 0.50–1.10)
GFR, Est African American: 119 mL/min/{1.73_m2} (ref >=60)
GFR, Est Non African American: 103 mL/min/{1.73_m2} (ref >=60)
Globulin: 2.5 g/dL (calc) (ref 1.9–3.7)
Glucose, Bld: 77 mg/dL (ref 65–99)
Potassium: 4.3 mmol/L (ref 3.5–5.3)
Sodium: 139 mmol/L (ref 135–146)
Total Bilirubin: 0.6 mg/dL (ref 0.2–1.2)
Total Protein: 6.2 g/dL (ref 6.1–8.1)

## 2020-03-28 LAB — TSH+FREE T4: TSH W/REFLEX TO FT4: 1.29 mIU/L

## 2020-03-28 LAB — LIPID PANEL
Cholesterol: 159 mg/dL (ref <200)
HDL: 49 mg/dL — ABNORMAL LOW (ref >=50)
LDL Cholesterol (Calc): 91 mg/dL (calc)
Non-HDL Cholesterol (Calc): 110 mg/dL (calc) (ref <130)
Total CHOL/HDL Ratio: 3.2 (calc) (ref <5.0)
Triglycerides: 96 mg/dL (ref <150)

## 2020-03-28 LAB — CYTOLOGY - PAP
Comment: NEGATIVE
Diagnosis: NEGATIVE
High risk HPV: NEGATIVE

## 2020-05-15 ENCOUNTER — Telehealth: Payer: Self-pay

## 2020-05-15 NOTE — Telephone Encounter (Signed)
Pt called in and stated that she is waiting  On call back for her test results. I told the pt I will send a message and that a nurse will be in touch. Please advise

## 2020-05-16 ENCOUNTER — Telehealth: Payer: Self-pay

## 2020-05-16 NOTE — Telephone Encounter (Signed)
Called pt couldn't leave a message no voice mail setup. SS informed me that we dont have any results for this pt.

## 2020-05-16 NOTE — Telephone Encounter (Addendum)
Pt called in and stated that she would like to talk more about her test results. I asked the pt what test results does she need to talk about. The pt was last seen in 2019. The pt stated on the phone that she was diagnosed with herpes. I told the pt that we will need to make an appointment for her to come in and talk to the provider about the next step. The pt verbally understood. And we made her an appointment to come in and talk to [place]. The pt has already had her Annual at her PCP

## 2020-05-20 NOTE — Telephone Encounter (Signed)
Pls see telephone encounter with BH.

## 2020-06-03 ENCOUNTER — Encounter: Payer: Self-pay | Admitting: Certified Nurse Midwife

## 2020-06-12 DIAGNOSIS — Z03818 Encounter for observation for suspected exposure to other biological agents ruled out: Secondary | ICD-10-CM | POA: Diagnosis not present

## 2020-06-12 DIAGNOSIS — U071 COVID-19: Secondary | ICD-10-CM | POA: Diagnosis not present

## 2020-12-27 ENCOUNTER — Other Ambulatory Visit: Payer: Self-pay

## 2020-12-27 DIAGNOSIS — Z3A00 Weeks of gestation of pregnancy not specified: Secondary | ICD-10-CM | POA: Diagnosis not present

## 2020-12-27 DIAGNOSIS — Z3A01 Less than 8 weeks gestation of pregnancy: Secondary | ICD-10-CM | POA: Diagnosis not present

## 2020-12-27 DIAGNOSIS — N9489 Other specified conditions associated with female genital organs and menstrual cycle: Secondary | ICD-10-CM | POA: Diagnosis not present

## 2020-12-27 DIAGNOSIS — O10011 Pre-existing essential hypertension complicating pregnancy, first trimester: Secondary | ICD-10-CM | POA: Insufficient documentation

## 2020-12-27 DIAGNOSIS — O209 Hemorrhage in early pregnancy, unspecified: Secondary | ICD-10-CM | POA: Diagnosis not present

## 2020-12-27 DIAGNOSIS — Z87891 Personal history of nicotine dependence: Secondary | ICD-10-CM | POA: Insufficient documentation

## 2020-12-28 ENCOUNTER — Emergency Department (HOSPITAL_COMMUNITY): Payer: BLUE CROSS/BLUE SHIELD

## 2020-12-28 ENCOUNTER — Encounter (HOSPITAL_COMMUNITY): Payer: Self-pay

## 2020-12-28 ENCOUNTER — Emergency Department (HOSPITAL_COMMUNITY)
Admission: EM | Admit: 2020-12-28 | Discharge: 2020-12-28 | Disposition: A | Discharge status: Home / Self Care | Payer: BLUE CROSS/BLUE SHIELD | Attending: Emergency Medicine | Admitting: Emergency Medicine

## 2020-12-28 ENCOUNTER — Other Ambulatory Visit: Payer: Self-pay

## 2020-12-28 DIAGNOSIS — O209 Hemorrhage in early pregnancy, unspecified: Secondary | ICD-10-CM | POA: Diagnosis not present

## 2020-12-28 DIAGNOSIS — O4690 Antepartum hemorrhage, unspecified, unspecified trimester: Secondary | ICD-10-CM

## 2020-12-28 LAB — CBC WITH DIFFERENTIAL/PLATELET
Abs Immature Granulocytes: 0.04 10*3/uL (ref 0.00–0.07)
Basophils Absolute: 0.1 10*3/uL (ref 0.0–0.1)
Basophils Relative: 1 %
Eosinophils Absolute: 0.6 10*3/uL — ABNORMAL HIGH (ref 0.0–0.5)
Eosinophils Relative: 4 %
HCT: 38.1 % (ref 36.0–46.0)
Hemoglobin: 12.5 g/dL (ref 12.0–15.0)
Immature Granulocytes: 0 %
Lymphocytes Relative: 20 %
Lymphs Abs: 2.6 10*3/uL (ref 0.7–4.0)
MCH: 28.5 pg (ref 26.0–34.0)
MCHC: 32.8 g/dL (ref 30.0–36.0)
MCV: 87 fL (ref 80.0–100.0)
Monocytes Absolute: 0.8 10*3/uL (ref 0.1–1.0)
Monocytes Relative: 6 %
Neutro Abs: 8.8 10*3/uL — ABNORMAL HIGH (ref 1.7–7.7)
Neutrophils Relative %: 69 %
Platelets: 318 10*3/uL (ref 150–400)
RBC: 4.38 MIL/uL (ref 3.87–5.11)
RDW: 15.9 % — ABNORMAL HIGH (ref 11.5–15.5)
WBC: 12.8 10*3/uL — ABNORMAL HIGH (ref 4.0–10.5)
nRBC: 0 % (ref 0.0–0.2)

## 2020-12-28 LAB — COMPREHENSIVE METABOLIC PANEL
ALT: 20 U/L (ref 0–44)
AST: 14 U/L — ABNORMAL LOW (ref 15–41)
Albumin: 3.8 g/dL (ref 3.5–5.0)
Alkaline Phosphatase: 36 U/L — ABNORMAL LOW (ref 38–126)
Anion gap: 9 (ref 5–15)
BUN: 11 mg/dL (ref 6–20)
CO2: 24 mmol/L (ref 22–32)
Calcium: 9.1 mg/dL (ref 8.9–10.3)
Chloride: 101 mmol/L (ref 98–111)
Creatinine, Ser: 0.81 mg/dL (ref 0.44–1.00)
GFR, Estimated: >60 mL/min (ref >60)
Glucose, Bld: 118 mg/dL — ABNORMAL HIGH (ref 70–99)
Potassium: 2.9 mmol/L — ABNORMAL LOW (ref 3.5–5.1)
Sodium: 134 mmol/L — ABNORMAL LOW (ref 135–145)
Total Bilirubin: 0.5 mg/dL (ref 0.3–1.2)
Total Protein: 6.8 g/dL (ref 6.5–8.1)

## 2020-12-28 LAB — PROTIME-INR
INR: 0.9 (ref 0.8–1.2)
Prothrombin Time: 12 seconds (ref 11.4–15.2)

## 2020-12-28 LAB — URINALYSIS, ROUTINE W REFLEX MICROSCOPIC
Bacteria, UA: NONE SEEN
Bilirubin Urine: NEGATIVE
Glucose, UA: NEGATIVE mg/dL
Leukocytes,Ua: NEGATIVE
Nitrite: NEGATIVE
Protein, ur: NEGATIVE mg/dL
Specific Gravity, Urine: >=1.03 (ref 1.005–1.030)
pH: 6 (ref 5.0–8.0)

## 2020-12-28 LAB — HCG, QUANTITATIVE, PREGNANCY: hCG, Beta Chain, Quant, S: 11114 m[IU]/mL — ABNORMAL HIGH (ref <5)

## 2020-12-28 LAB — PREGNANCY, URINE: Preg Test, Ur: POSITIVE — AB

## 2020-12-28 LAB — ABO/RH: ABO/RH(D): A POS

## 2020-12-28 NOTE — ED Provider Notes (Signed)
Emergency Department Provider Note   I have reviewed the triage vital signs and the nursing notes.   HISTORY  Chief Complaint Vaginal Bleeding   HPI Scottlynn Lindell is a 30 y.o. female with past medical history reviewed below presents to the emergency department with some vaginal bleeding with passage of tissue today.  Patient states that for the past several weeks she been experiencing some early pregnancy type symptoms including breast tenderness, mild nausea, occasional cramping in the abdomen.  No severe abdominal pain.  No vaginal bleeding.  Her last menstrual cycle was 6 to 7 weeks ago.  She has not been pregnant in the past.  She began having some heavier bleeding today and then noted some passage of tissue and clot.  She suspected that this may have been a miscarriage.  No prior history of similar.  Her abdominal pain has resolved and she is no longer having heavy bleeding.   Past Medical History:  Diagnosis Date   Hx of abnormal cervical Pap smear    Pt with several normal paps since abnormal pap   Hypertension    Irregular periods    history    Patient Active Problem List   Diagnosis Date Noted   Annual physical exam 03/27/2020   Screening, lipid 03/27/2020   Screening for cervical cancer 03/27/2020   Screening for thyroid disorder 03/27/2020   Runny nose 08/02/2019   Cough 08/02/2019   Diarrhea 08/02/2019   Hx of abnormal cervical Pap smear    Essential hypertension 07/05/2017    Past Surgical History:  Procedure Laterality Date   colposcopy      Allergies Patient has no known allergies.  Family History  Problem Relation Age of Onset   Hypertension Mother    Stroke Mother        mini stroke?   Thyroid disease Mother        thyroid removed   Hypertension Sister     Social History Social History   Tobacco Use   Smoking status: Former    Packs/day: 0.50    Years: 7.00    Pack years: 3.50    Types: Cigarettes    Quit date: 07/05/2014    Years  since quitting: 6.4   Smokeless tobacco: Never  Vaping Use   Vaping Use: Never used  Substance Use Topics   Alcohol use: Yes    Comment: seldom   Drug use: Yes    Types: Marijuana    Review of Systems  Constitutional: No fever/chills Eyes: No visual changes. ENT: No sore throat. Cardiovascular: Denies chest pain. Respiratory: Denies shortness of breath. Gastrointestinal: No abdominal pain.  No nausea, no vomiting.  No diarrhea.  No constipation. Genitourinary: Negative for dysuria. Positive vaginal bleeding and passage of tissue.  Musculoskeletal: Negative for back pain. Skin: Negative for rash. Neurological: Negative for headaches, focal weakness or numbness.  10-point ROS otherwise negative.  ____________________________________________   PHYSICAL EXAM:  VITAL SIGNS: ED Triage Vitals  Enc Vitals Group     BP 12/27/20 2339 (!) 161/106     Pulse Rate 12/27/20 2339 90     Resp 12/27/20 2339 18     Temp 12/27/20 2339 99 F (37.2 C)     Temp Source 12/27/20 2339 Oral     SpO2 12/27/20 2339 100 %     Weight 12/27/20 2339 218 lb 11.2 oz (99.2 kg)     Height 12/27/20 2339 5\' 8"  (1.727 m)   Constitutional: Alert and oriented. Well appearing  and in no acute distress. Eyes: Conjunctivae are normal.  Head: Atraumatic. Nose: No congestion/rhinnorhea. Mouth/Throat: Mucous membranes are moist.   Neck: No stridor.   Cardiovascular: Normal rate, regular rhythm. Good peripheral circulation. Grossly normal heart sounds.   Respiratory: Normal respiratory effort.  No retractions. Lungs CTAB. Gastrointestinal: Soft and nontender. No distention.  Genitourinary: Patient declined.  Musculoskeletal:  No gross deformities of extremities. Neurologic:  Normal speech and language.  Skin:  Skin is warm, dry and intact. No rash noted.  ____________________________________________   LABS (all labs ordered are listed, but only abnormal results are displayed)  Labs Reviewed   COMPREHENSIVE METABOLIC PANEL - Abnormal; Notable for the following components:      Result Value   Sodium 134 (*)    Potassium 2.9 (*)    Glucose, Bld 118 (*)    AST 14 (*)    Alkaline Phosphatase 36 (*)    All other components within normal limits  CBC WITH DIFFERENTIAL/PLATELET - Abnormal; Notable for the following components:   WBC 12.8 (*)    RDW 15.9 (*)    Neutro Abs 8.8 (*)    Eosinophils Absolute 0.6 (*)    All other components within normal limits  PREGNANCY, URINE - Abnormal; Notable for the following components:   Preg Test, Ur POSITIVE (*)    All other components within normal limits  URINALYSIS, ROUTINE W REFLEX MICROSCOPIC - Abnormal; Notable for the following components:   Color, Urine YELLOW (*)    APPearance CLEAR (*)    Hgb urine dipstick LARGE (*)    Ketones, ur TRACE (*)    All other components within normal limits  HCG, QUANTITATIVE, PREGNANCY - Abnormal; Notable for the following components:   hCG, Beta Chain, Quant, S 11,114 (*)    All other components within normal limits  PROTIME-INR  ABO/RH   ____________________________________________  RADIOLOGY  US OB Comp < 14 Wks  Result Date: 12/28/2020 CLINICAL DATA:  Pregnant with vaginal bleeding. EXAM: OBSTETRIC <14 WK ULTRASOUND TECHNIQUE: Transabdominal ultrasound was performed for evaluation of the gestation as well as the maternal uterus and adnexal regions. COMPARISON:  None. FINDINGS: Intrauterine gestational sac: None Yolk sac:  Not Visualized. Embryo:  Not Visualized. Cardiac Activity: Not Visualized. Heart Rate: N/A bpm Maternal uterus/adnexae: The endometrium is thickened (1.81 cm) and heterogeneous in appearance. The right ovary is visualized and is normal in appearance. A corpus luteum cyst is suspected within the left ovary. No pelvic free fluid is identified. IMPRESSION: Thickened, heterogeneous endometrium without evidence of an intrauterine pregnancy. The presence of blood products within the  endometrial canal cannot be excluded. Electronically Signed   By: Aram Candela M.D.   On: 12/28/2020 03:37   US OB Transvaginal  Result Date: 12/28/2020 CLINICAL DATA:  Pregnant with vaginal bleeding. EXAM: OBSTETRIC <14 WK [place] AND TRANSVAGINAL OB US TECHNIQUE: Both transabdominal and transvaginal ultrasound examinations were performed for complete evaluation of the gestation as well as the maternal uterus, adnexal regions, and pelvic cul-de-sac. Transvaginal technique was performed to assess early pregnancy. COMPARISON:  None. FINDINGS: Intrauterine gestational sac: None Yolk sac:  Not Visualized. Embryo:  Not Visualized. Cardiac Activity: Not Visualized. Heart Rate: N/A bpm Maternal uterus/adnexae: The endometrium is thickened (1.81 cm) and heterogeneous in appearance. The right ovary is visualized and is normal in appearance. A corpus luteum cyst is suspected within the left ovary. No pelvic free fluid is identified. IMPRESSION: Thickened, heterogeneous endometrium without evidence of an intrauterine pregnancy. The presence of blood  products within the endometrial canal cannot be excluded. Electronically Signed   By: Aram Candela M.D.   On: 12/28/2020 03:37    ____________________________________________   PROCEDURES  Procedure(s) performed:   Procedures  None  ____________________________________________   INITIAL IMPRESSION / ASSESSMENT AND PLAN / ED COURSE  Pertinent labs & imaging results that were available during my care of the patient were reviewed by me and considered in my medical decision making (see chart for details).   Patient presents to the emergency department with cramping abdominal pain with vaginal bleeding which is largely improved.  She has a picture on her phone showing what appears to be some tissue, question products of conception.  No prior pregnancies or history of miscarriage.  Plan for labs including ABO Rh, quant, CBC/chemistry. Clinically suspect  completed miscarriage.   04:00 AM  Lab work reviewed.  Patient urine pregnancy is come back positive and hCG of 11,000 noted.  No anemia.  Coags are normal.  No evidence of urine infection.  Obtained a transvaginal ultrasound which did not visualize an intrauterine pregnancy.  Clinically, patient appears to have had a miscarriage with passage of tissue today but advised that she will need repeat beta-hCG in 48 hours.  She is no longer having abdominal pain or bleeding.  She declines pelvic exam here in the emergency department.  I have given contact information for OB/GYN office for repeat lab work.  Advised that if this cannot be arranged she can return to the emergency department for repeat hCG as well.  Discussed return precautions with return of abdominal pain or vaginal bleeding. Suspicion for ectopic pregnancy is exceedingly low.  ____________________________________________  FINAL CLINICAL IMPRESSION(S) / ED DIAGNOSES  Final diagnoses:  Vaginal bleeding in pregnancy     Note:  This document was prepared using Dragon voice recognition software and may include unintentional dictation errors.  Alona Bene, MD, [facility] Emergency Medicine    Tyffani Foglesong, Arlyss Repress, MD 12/28/20 [phone]

## 2020-12-28 NOTE — Discharge Instructions (Addendum)
You were seen in the emergency department today with vaginal bleeding.  Your pregnancy test did come back positive but as we discussed I suspect you may have had a miscarriage.  We will need to repeat your pregnancy hormone levels on Monday.  Please call the OB/GYN office listed to schedule that appointment.  If you are unable to get that scheduled you can return to the emergency department for repeat blood work.   If, in the meantime, you develop return of abdominal pain or vaginal bleeding you should return to the emergency department immediately for exam and reevaluation.

## 2020-12-28 NOTE — ED Triage Notes (Signed)
Pt reports having an episode of vaginal bleeding. Pt reports last period was about 6-7 weeks ago and believes she had a miscarriage. Pt has hx of PCOS.

## 2020-12-28 NOTE — ED Notes (Signed)
Ultrasound at bedside

## 2020-12-28 NOTE — ED Notes (Signed)
Pt discharged from this ED in stable condition at this time. All discharge instructions and follow up care reviewed with pt with no further questions at this time. Pt ambulatory with steady gait, clear speech.  

## 2020-12-30 ENCOUNTER — Ambulatory Visit (INDEPENDENT_AMBULATORY_CARE_PROVIDER_SITE_OTHER): Payer: BLUE CROSS/BLUE SHIELD | Admitting: General Practice

## 2020-12-30 ENCOUNTER — Other Ambulatory Visit: Payer: Self-pay

## 2020-12-30 VITALS — BP 116/74 | HR 86

## 2020-12-30 DIAGNOSIS — O3680X0 Pregnancy with inconclusive fetal viability, not applicable or unspecified: Secondary | ICD-10-CM

## 2020-12-30 DIAGNOSIS — O039 Complete or unspecified spontaneous abortion without complication: Secondary | ICD-10-CM

## 2020-12-30 LAB — BETA HCG QUANT (REF LAB): hCG Quant: 830 m[IU]/mL

## 2020-12-30 MED ORDER — IBUPROFEN 800 MG PO TABS: 800.0000 mg | ORAL_TABLET | Freq: Three times a day (TID) | ORAL | 0 refills | Status: DC | PRN

## 2020-12-30 NOTE — Progress Notes (Signed)
Patient presents to office today for stat bhcg following up from ER visit on 7/23. Patient reports bleeding similar to a period started on Friday and that's what made her go to the ER but bleeding has lightened up since then. She also reports mild cramping. Discussed with patient we are monitoring your bhcg levels, results take approximately 2 hours to finalize and will be reviewed with a provider in the office. Advised we will then call you with results/updated plan of care. Patient verbalized understanding & provided call back number [phone].  Reviewed bhcg results with Dr Debroah Loop who finds decreasing bhcg levels indicative of SAB- patient should follow up in 1 week for repeat check.   Called patient at home and reviewed results with her and follow up recommendation. Patient verbalized understanding and states she can come back 8/2 @ 330. Patient reports cramping this afternoon and wants to know if that is normal. Told patient cramping is expected but should improve. Ibuprofen Rx sent to pharmacy per protocol & patient informed. Discussed periods should return in 4-6 weeks once hormone levels return to normal. Offered follow up appt with a provider and patient states she would like an appt. Advised someone from the front office will reach out to her with an appt.   Chase Caller RN BSN 12/30/20

## 2021-01-07 ENCOUNTER — Other Ambulatory Visit: Payer: Self-pay

## 2021-01-07 ENCOUNTER — Other Ambulatory Visit: Payer: BLUE CROSS/BLUE SHIELD

## 2021-01-07 DIAGNOSIS — O039 Complete or unspecified spontaneous abortion without complication: Secondary | ICD-10-CM

## 2021-01-08 ENCOUNTER — Ambulatory Visit: Payer: Self-pay | Admitting: Certified Nurse Midwife

## 2021-01-08 LAB — BETA HCG QUANT (REF LAB): hCG Quant: 39 m[IU]/mL

## 2021-04-09 ENCOUNTER — Other Ambulatory Visit: Payer: Self-pay

## 2021-04-28 ENCOUNTER — Other Ambulatory Visit: Payer: Self-pay | Admitting: Family Medicine

## 2021-04-28 DIAGNOSIS — I10 Essential (primary) hypertension: Secondary | ICD-10-CM

## 2021-04-28 NOTE — Telephone Encounter (Signed)
Medication Refill - Medication:  hydrochlorothiazide (HYDRODIURIL) 12.5 MG tablet   Has the patient contacted their pharmacy? Yes.   Contact pcp  Preferred Pharmacy (with phone number or street name):  [id number] [place], [place] - [address] AT [facility] OF [facility] & [address]  Phone:  [phone] Fax:  [phone]  Has the patient been seen for an appointment in the last year OR does the patient have an upcoming appointment? Yes.  11/29  Agent: Please be advised that RX refills may take up to 3 business days. We ask that you follow-up with your pharmacy.

## 2021-04-28 NOTE — Telephone Encounter (Signed)
Requested medications are on the active medication list yes  Last visit 03/27/20  Future visit scheduled 05/06/21  Notes to clinic Failed protocol due to no valid visit within 6  months, please assess. ALSO, has Laura, [name] listed as PCP, has upcoming appt with Dr. Kirtland Bouchard, please assess. Requested Prescriptions  Pending Prescriptions Disp Refills   hydrochlorothiazide (HYDRODIURIL) 12.5 MG tablet 90 tablet 3    Sig: TAKE 1 TABLET(12.5 MG) BY MOUTH DAILY     Cardiovascular: Diuretics - Thiazide Failed - 04/28/2021  3:54 PM      Failed - K in normal range and within 360 days    Potassium  Date Value Ref Range Status  12/28/2020 2.9 (L) 3.5 - 5.1 mmol/L Final          Failed - Na in normal range and within 360 days    Sodium  Date Value Ref Range Status  12/28/2020 134 (L) 135 - 145 mmol/L Final          Failed - Valid encounter within last 6 months    Recent Outpatient Visits           1 year ago Annual physical exam   [facility], Jodelle Gross, FNP   1 year ago Essential hypertension   [facility], Jodelle Gross, FNP   2 years ago Suspected Covid-19 Virus Infection   [facility] Smitty Cords, DO   2 years ago Encounter for annual physical exam   [facility] [name], [name]   3 years ago Essential hypertension   [facility] [name], [name], [name]       Future Appointments             In 1 week [name], Laura Neat, DO [facility]            Passed - Ca in normal range and within 360 days    Calcium  Date Value Ref Range Status  12/28/2020 9.1 8.9 - 10.3 mg/dL Final          Passed - Cr in normal range and within 360 days    Creat  Date Value Ref Range Status  03/27/2020 0.77 0.50 - 1.10 mg/dL Final   Creatinine, Ser  Date Value Ref Range Status  12/28/2020 0.81 0.44 - 1.00 mg/dL Final          Passed - Last BP in normal  range    BP Readings from Last 1 Encounters:  12/30/20 116/74

## 2021-05-06 ENCOUNTER — Other Ambulatory Visit: Payer: Self-pay

## 2021-05-06 ENCOUNTER — Ambulatory Visit: Payer: BLUE CROSS/BLUE SHIELD | Admitting: Family Medicine

## 2021-05-06 ENCOUNTER — Encounter: Payer: Self-pay | Admitting: Family Medicine

## 2021-05-06 VITALS — BP 122/84 | HR 80 | Ht 68.0 in | Wt 217.6 lb

## 2021-05-06 DIAGNOSIS — I10 Essential (primary) hypertension: Secondary | ICD-10-CM | POA: Diagnosis not present

## 2021-05-06 DIAGNOSIS — B9689 Other specified bacterial agents as the cause of diseases classified elsewhere: Secondary | ICD-10-CM

## 2021-05-06 DIAGNOSIS — Z202 Contact with and (suspected) exposure to infections with a predominantly sexual mode of transmission: Secondary | ICD-10-CM

## 2021-05-06 DIAGNOSIS — B379 Candidiasis, unspecified: Secondary | ICD-10-CM

## 2021-05-06 DIAGNOSIS — Z113 Encounter for screening for infections with a predominantly sexual mode of transmission: Secondary | ICD-10-CM | POA: Diagnosis not present

## 2021-05-06 DIAGNOSIS — N760 Acute vaginitis: Secondary | ICD-10-CM | POA: Diagnosis not present

## 2021-05-06 DIAGNOSIS — T3695XA Adverse effect of unspecified systemic antibiotic, initial encounter: Secondary | ICD-10-CM

## 2021-05-06 MED ORDER — METRONIDAZOLE 500 MG PO TABS
500.0000 mg | ORAL_TABLET | Freq: Two times a day (BID) | ORAL | 0 refills | Status: DC
Start: 2021-05-06 — End: 2022-08-24

## 2021-05-06 MED ORDER — FLUCONAZOLE 150 MG PO TABS: ORAL_TABLET | ORAL | 0 refills | Status: DC

## 2021-05-06 MED ORDER — HYDROCHLOROTHIAZIDE 12.5 MG PO TABS: 12.5000 mg | ORAL_TABLET | Freq: Every day | ORAL | 3 refills | Status: DC

## 2021-05-06 NOTE — Patient Instructions (Addendum)
Thank you for coming to the office today.  Stay tuned for swab test results.  1. For Bacterial Vaginosis (BV) take Metronidazole 500mg  twice daily for 7 days, do not drink alcohol on this med as it will cause nausea/vomiting. - This problem can be recurrent. Some people are more likely to get it than others, and it has to do with normal body chemistry and vaginal environment. One of the biggest risk factors for causing BV is unprotected sexual intercourse (without a condom), because semen can change the chemistry of your vaginal environment, causing the "good bacteria" reduce and the other bacteria to grow and cause your symptoms. Recommend to start using condoms with intercourse to see if this helps reduce your symptoms, do this especially for next 1 week while on treatment. Some other recommendations include taking a daily probiotic and yogurt can help reduce BV as well, this is not proven to work in all patients.  If your symptoms do not resolve with the treatment, we will need to re-evaluate you to make sure that you didn't develop a yeast infection or other concerns. If you do not tolerate the pills, we can switch your prescription to topical Metrogel, please notify if you need this change.  - If positive for Yeast - will send Diflucan 150mg  pill, take 1 and then on Day 3 take 2nd pill. - If both medicines sent, finish Metronidazole FIRST then take Diflucan.  If tests negative, maybe normal symptoms following period. Unprotected intercourse can cause irritation and no infection sometimes. Make sure to try using condoms to see if reduces your symptoms. Probiotics and yogurt can help reduce BV as well.   Please schedule a Follow-up Appointment to: Return in about 3 months (around 08/05/2021) for 3 month follow-up HTN with PCP [facility].  If you have any other questions or concerns, please feel free to call the office or send a message through MyChart. You may also schedule an earlier appointment  if necessary.  Additionally, you may be receiving a survey about your experience at our office within a few days to 1 week by e-mail or mail. We value your feedback.  08/07/2021, DO [facility], Saralyn Pilar

## 2021-05-06 NOTE — Progress Notes (Signed)
Subjective:    Patient ID: [name], female    DOB: Sep 17, 1990, 30 y.o.   MRN: [id number]  [name] is a 30 y.o. female presenting on 05/06/2021 for Hypertension  Previous PCP Danielle Rankin, FNP   Has not established yet with Nicki Reaper, FNP   HPI  CHRONIC HTN: Reports elevated over past 3-4 years, has done well on HCTZ med, she was out of med for 4-5 days then picked up emergency fill for few days at [facility] pharmacy. - She has fam history of HTN and she is interested in coming off medication Current Meds - HCTZ 12.5mg  daily Reports good compliance, took meds today. Tolerating well, w/o complaints. Denies CP, dyspnea, HA, edema, dizziness / lightheadedness  STD Screening Vaginal Odor and Discharge Reports symptoms onset for past 4-5 days. History of BV, feels similar. Has had yeast before as well No known STD. Her current partner had some blood discharge on him when woke up question where it was from, question if other partner involve previously. Denies fever chills nausea vomiting  No missed menstrual cycle   Health Maintenance:  Due for Flu Shot, declines today despite counseling on benefits   Depression screen [facility] 2/9 05/06/2021 12/30/2020 08/02/2019  Decreased Interest 1 2 1   Down, Depressed, Hopeless 1 3 0  PHQ - 2 Score 2 5 1   Altered sleeping 1 3 -  Tired, decreased energy 1 2 -  Change in appetite 1 3 -  Feeling bad or failure about yourself  1 3 -  Trouble concentrating 0 2 -  Moving slowly or fidgety/restless 0 0 -  Suicidal thoughts 0 - -  PHQ-9 Score 6 18 -  Difficult doing work/chores Somewhat difficult - -    Social History   Tobacco Use   Smoking status: Former    Packs/day: 0.50    Years: 7.00    Pack years: 3.50    Types: Cigarettes    Quit date: 07/05/2014    Years since quitting: 6.8   Smokeless tobacco: Never  Vaping Use   Vaping Use: Never used  Substance Use Topics   Alcohol use: Yes    Comment: seldom   Drug use:  Yes    Types: Marijuana    Review of Systems Per HPI unless specifically indicated above     Objective:    BP 122/84 (BP Location: Left Arm, Cuff Size: Normal)   Pulse 80   Ht 5\' 8"  (1.727 m)   Wt 217 lb 9.6 oz (98.7 kg)   SpO2 100%   BMI 33.09 kg/m   Wt Readings from Last 3 Encounters:  05/06/21 217 lb 9.6 oz (98.7 kg)  12/27/20 218 lb 11.2 oz (99.2 kg)  03/27/20 220 lb 3.2 oz (99.9 kg)    Physical Exam Vitals and nursing note reviewed.  Constitutional:      General: She is not in acute distress.    Appearance: Normal appearance. She is well-developed. She is not diaphoretic.     Comments: Well-appearing, comfortable, cooperative  HENT:     Head: Normocephalic and atraumatic.  Eyes:     General:        Right eye: No discharge.        Left eye: No discharge.     Conjunctiva/sclera: Conjunctivae normal.  Cardiovascular:     Rate and Rhythm: Normal rate.  Pulmonary:     Effort: Pulmonary effort is normal.  Skin:    General: Skin is warm and dry.  Findings: No erythema or rash.  Neurological:     Mental Status: She is alert and oriented to person, place, and time.  Psychiatric:        Mood and Affect: Mood normal.        Behavior: Behavior normal.        Thought Content: Thought content normal.     Comments: Well groomed, good eye contact, normal speech and thoughts     Results for orders placed or performed in visit on 01/07/21  Beta hCG quant (ref lab)  Result Value Ref Range   hCG Quant 39 mIU/mL      Assessment & Plan:   Problem List Items Addressed This Visit     Essential hypertension - Primary   Relevant Medications   hydrochlorothiazide (HYDRODIURIL) 12.5 MG tablet   Other Visit Diagnoses     Screening examination for STD (sexually transmitted disease)       Relevant Orders   HIV Antibody (routine testing w rflx)   RPR   Cervicovaginal ancillary only   Potential exposure to STD       Relevant Orders   HIV Antibody (routine testing w  rflx)   RPR   Cervicovaginal ancillary only   BV (bacterial vaginosis)       Relevant Medications   metroNIDAZOLE (FLAGYL) 500 MG tablet   fluconazole (DIFLUCAN) 150 MG tablet   Antibiotic-induced yeast infection       Relevant Medications   metroNIDAZOLE (FLAGYL) 500 MG tablet   fluconazole (DIFLUCAN) 150 MG tablet       Hypertension Chronic problem Had been controlled, out of med x 4-5 days then back on it. Will re order HCTZ 12.5mg  daily for now, discussed that she will need detailed home BP readings to monitor her control/progress. If lifestyle components improved and other factors controlled in future she may be able to come off medication to do trial off med if her BP remains controlled could remain off and just manage with lifestyle. However she has some fam history of HTN we discussed may have familial/genetic component and this could require med again. Will defer any long term HTN management changes today as not previously established with patient.  #STD Screening #potential STD exposure Currently symptoms of vaginal discharge and odor Question if partner with other sexual partner recently Will check lab blood test HIV RPR and Vaginal self collect swab today for GC Chlamydia, Trichomonas, BV Yeast  Empiric therapy given symptoms classic to prior history with Metronidazole for BV then Fluconazole for yeast if develops.  Route chart to Nicki Reaper, FNP for lab review - if results come back this week can share with patient.  Meds ordered this encounter  Medications   hydrochlorothiazide (HYDRODIURIL) 12.5 MG tablet    Sig: Take 1 tablet (12.5 mg total) by mouth daily.    Dispense:  90 tablet    Refill:  3   metroNIDAZOLE (FLAGYL) 500 MG tablet    Sig: Take 1 tablet (500 mg total) by mouth 2 (two) times daily. Do not drink alcohol while taking this medicine.    Dispense:  14 tablet    Refill:  0   fluconazole (DIFLUCAN) 150 MG tablet    Sig: Take one tablet by mouth on  Day 1. Repeat dose 2nd tablet on Day 3.    Dispense:  2 tablet    Refill:  0      Follow up plan: Return in about 3 months (around 08/05/2021) for 3 month  follow-up HTN with PCP Rene Kocher.   Saralyn Pilar, DO [facility] 05/06/2021, 9:30 AM

## 2021-05-07 LAB — RPR: RPR Ser Ql: NONREACTIVE

## 2021-05-07 LAB — HIV ANTIBODY (ROUTINE TESTING W REFLEX): HIV 1&2 Ab, 4th Generation: NONREACTIVE

## 2021-05-13 ENCOUNTER — Telehealth: Payer: Self-pay | Admitting: Family Medicine

## 2021-05-13 NOTE — Telephone Encounter (Signed)
Called patient. Received [facility] NuSwab Vaginitis results via fax today 05/13/21  Positive BV x 3 = high abnormal score.  All other tests negative - Yeast, Trichomonas, Chlamydia, Gonorrhea.  She picked up meds, waiting to hear back. I advised start Metronidazole first, if develop yeast symptoms can finish with Diflucan if need. F/u if not improved  Saralyn Pilar, DO [facility] 05/13/2021, 6:30 PM

## 2022-02-16 ENCOUNTER — Other Ambulatory Visit: Payer: Self-pay | Admitting: Family Medicine

## 2022-02-16 DIAGNOSIS — I10 Essential (primary) hypertension: Secondary | ICD-10-CM

## 2022-02-17 NOTE — Telephone Encounter (Signed)
Requested medication (s) are due for refill today: no  Requested medication (s) are on the active medication list: yes  Last refill:  05/06/21  Future visit scheduled: no  Notes to clinic:  Unable to refill per protocol, appointment needed.  Called, no answer. Left vm to call back to schedule OV. Pt should have enough medication until October, last RF 05/06/21 for 90 and 3 refills. Routing for approval.     Requested Prescriptions  Pending Prescriptions Disp Refills   hydrochlorothiazide (HYDRODIURIL) 12.5 MG tablet [Pharmacy Med Name: HYDROCHLOROTHIAZIDE 12.5MG  TABLETS] 90 tablet 3    Sig: TAKE 1 TABLET(12.5 MG) BY MOUTH DAILY     Cardiovascular: Diuretics - Thiazide Failed - 02/16/2022  8:01 AM      Failed - Cr in normal range and within 180 days    Creat  Date Value Ref Range Status  03/27/2020 0.77 0.50 - 1.10 mg/dL Final   Creatinine, Ser  Date Value Ref Range Status  12/28/2020 0.81 0.44 - 1.00 mg/dL Final         Failed - K in normal range and within 180 days    Potassium  Date Value Ref Range Status  12/28/2020 2.9 (L) 3.5 - 5.1 mmol/L Final         Failed - Na in normal range and within 180 days    Sodium  Date Value Ref Range Status  12/28/2020 134 (L) 135 - 145 mmol/L Final         Failed - Valid encounter within last 6 months    Recent Outpatient Visits           9 months ago Essential hypertension   [facility] Smitty Cords, DO   1 year ago Annual physical exam   [facility], Jodelle Gross, FNP   2 years ago Essential hypertension   [facility], Jodelle Gross, FNP   3 years ago Suspected Covid-19 Virus Infection   [facility] [place], [name], DO   3 years ago Encounter for annual physical exam   [facility] Galen Manila, NP              Passed - Last BP in normal range    BP Readings from Last 1 Encounters:  05/06/21 122/84

## 2022-06-01 IMAGING — US US OB COMP LESS 14 WK
1 series · 15 of 28 positions shown · non-contrast
Comparison: None.

CLINICAL DATA: Pregnant with vaginal bleeding.

EXAM:
OBSTETRIC <14 WK ULTRASOUND
TECHNIQUE: Transabdominal ultrasound was performed for evaluation of the
gestation as well as the maternal uterus and adnexal regions.

[Series 1: us ob comp less 14 wks mc & wl · 66 acquisitions, 15 frames shown]
[im 1/66]
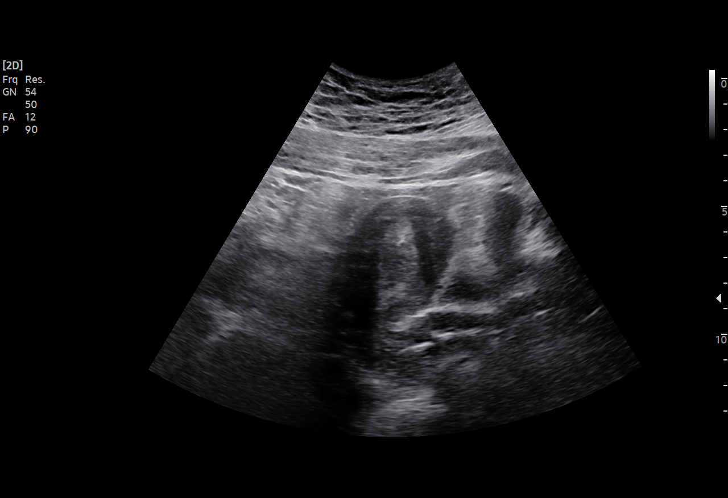
[im 5/66]
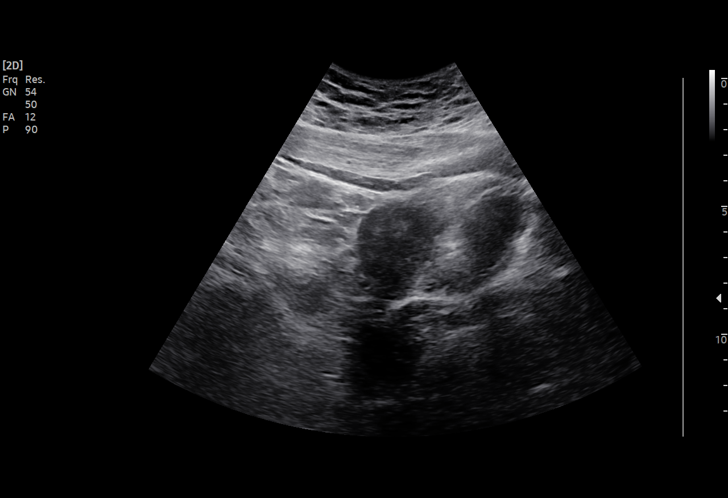
[im 10/66]
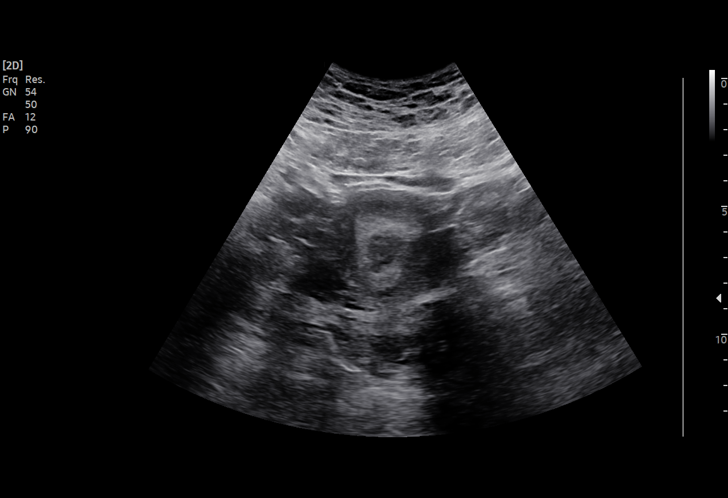
[im 15/66]
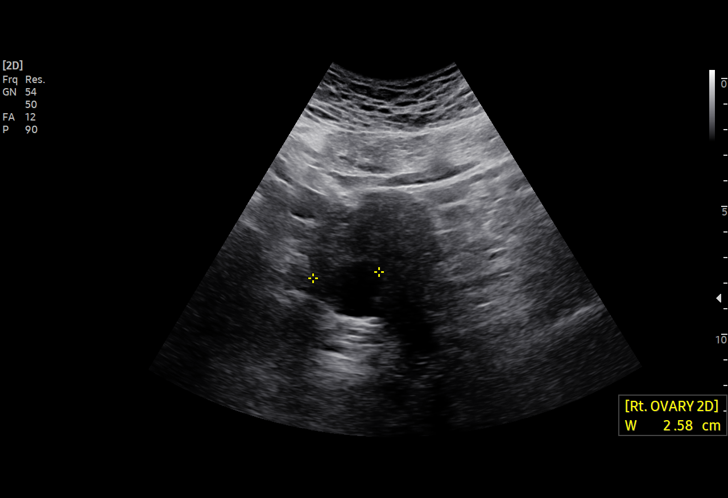
[im 20/66]
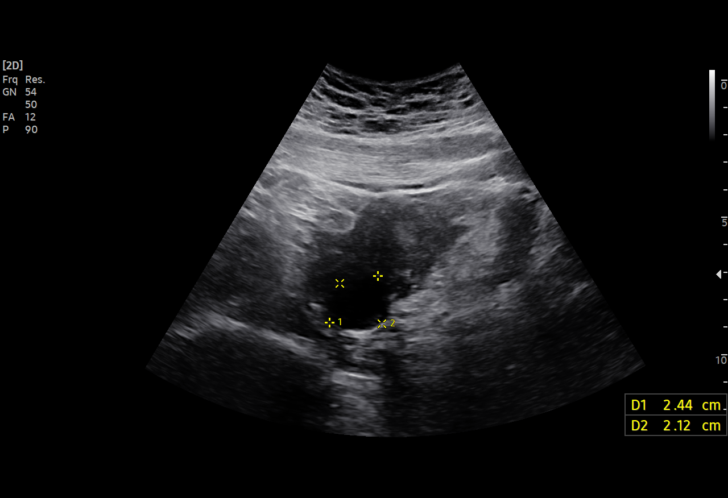
[im 25/66]
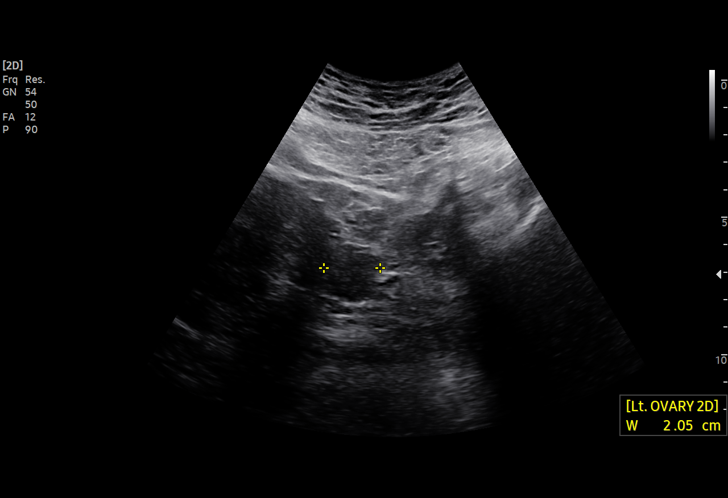
[im 29/66]
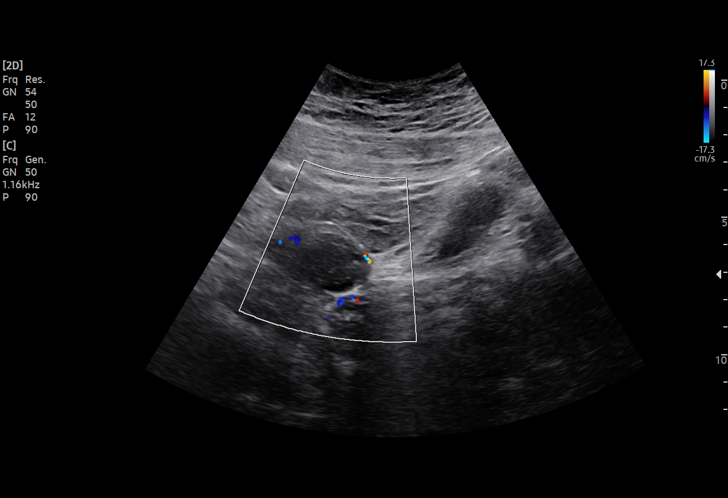
[im 34/66]
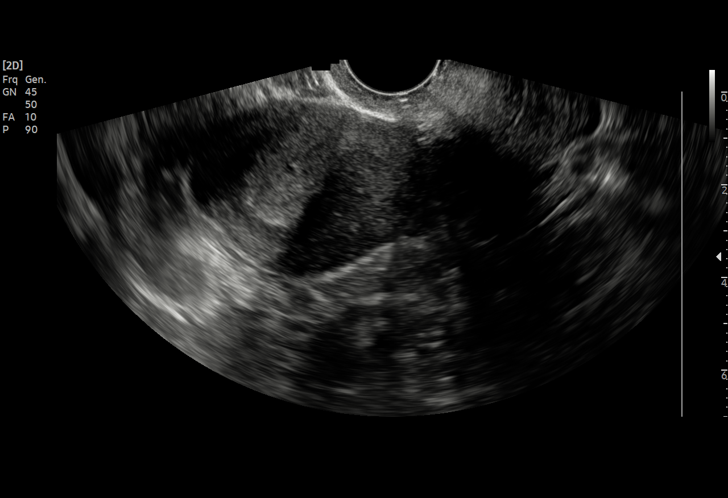
[im 37/66]
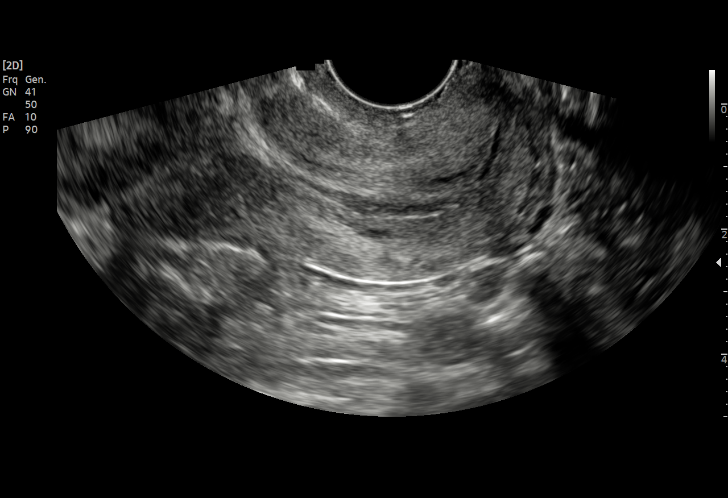
[im 41/66]
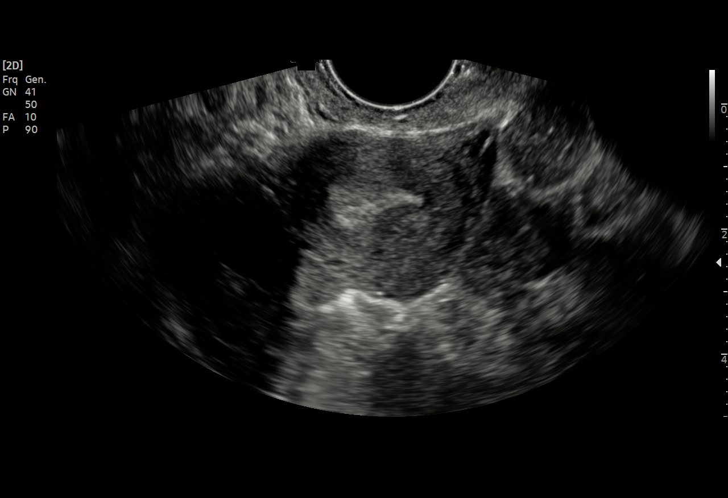
[im 46/66]
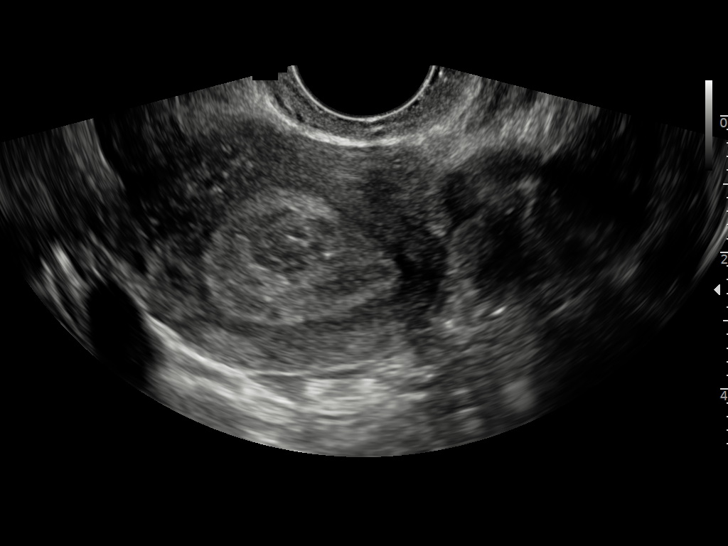
[im 51/66]
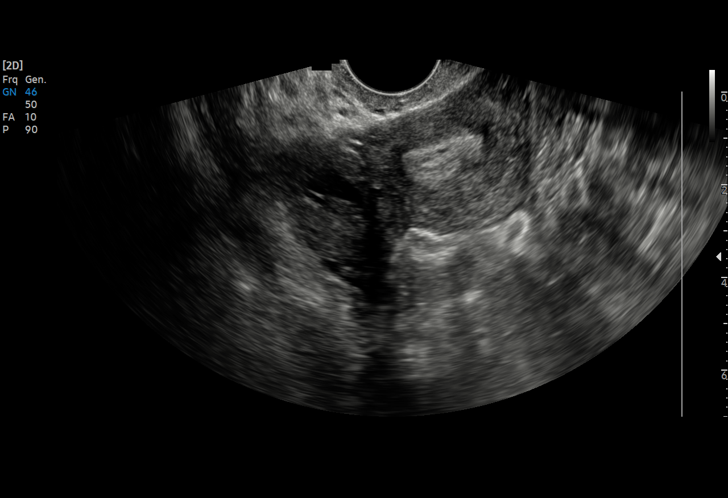
[im 56/66]
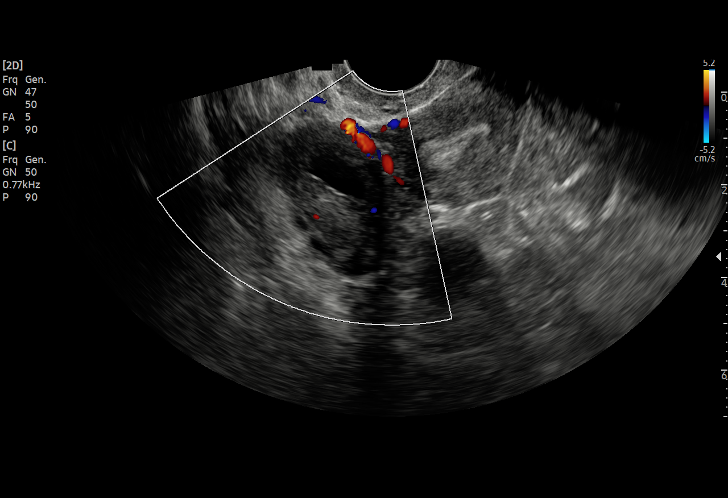
[im 61/66]
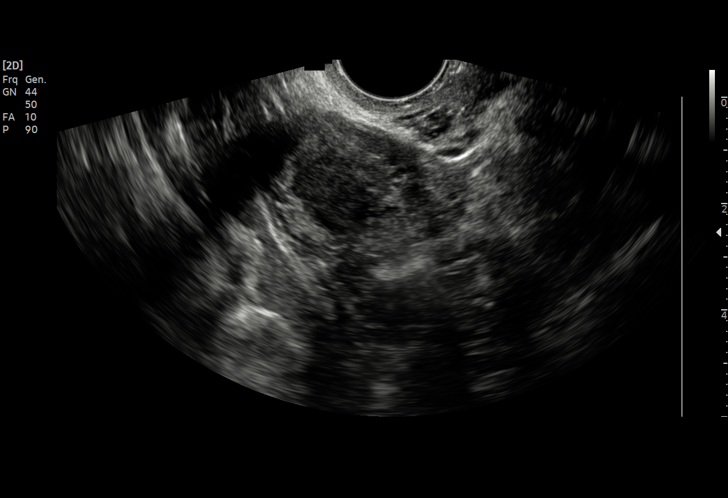
[im 66/66]
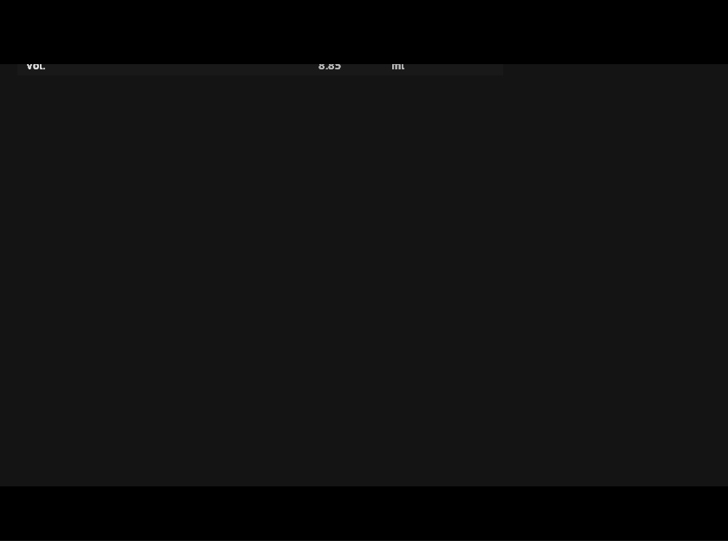

[15 of 28 positions shown; findings below may reference images not displayed]

FINDINGS: Intrauterine gestational sac: None

Yolk sac:  Not Visualized.

Embryo:  Not Visualized.

Cardiac Activity: Not Visualized.

Heart Rate: N/A bpm

Maternal uterus/adnexae: The endometrium is thickened (1.81 cm) and
heterogeneous in appearance.

The right ovary is visualized and is normal in appearance.

A corpus luteum cyst is suspected within the left ovary.

No pelvic free fluid is identified.
IMPRESSION: Thickened, heterogeneous endometrium without evidence of an
intrauterine pregnancy. The presence of blood products within the
endometrial canal cannot be excluded.

## 2022-06-01 IMAGING — US US OB TRANSVAGINAL
1 series · 1 of 1 positions shown · non-contrast
Comparison: None.

CLINICAL DATA: Pregnant with vaginal bleeding.

EXAM:
OBSTETRIC <14 WK US AND TRANSVAGINAL OB US
TECHNIQUE: Both transabdominal and transvaginal ultrasound examinations were
performed for complete evaluation of the gestation as well as the
maternal uterus, adnexal regions, and pelvic cul-de-sac.
Transvaginal technique was performed to assess early pregnancy.

[Series 1: us ob comp less 14 wks mc & wl · 1 of 1 slices shown]
[im 1/1]
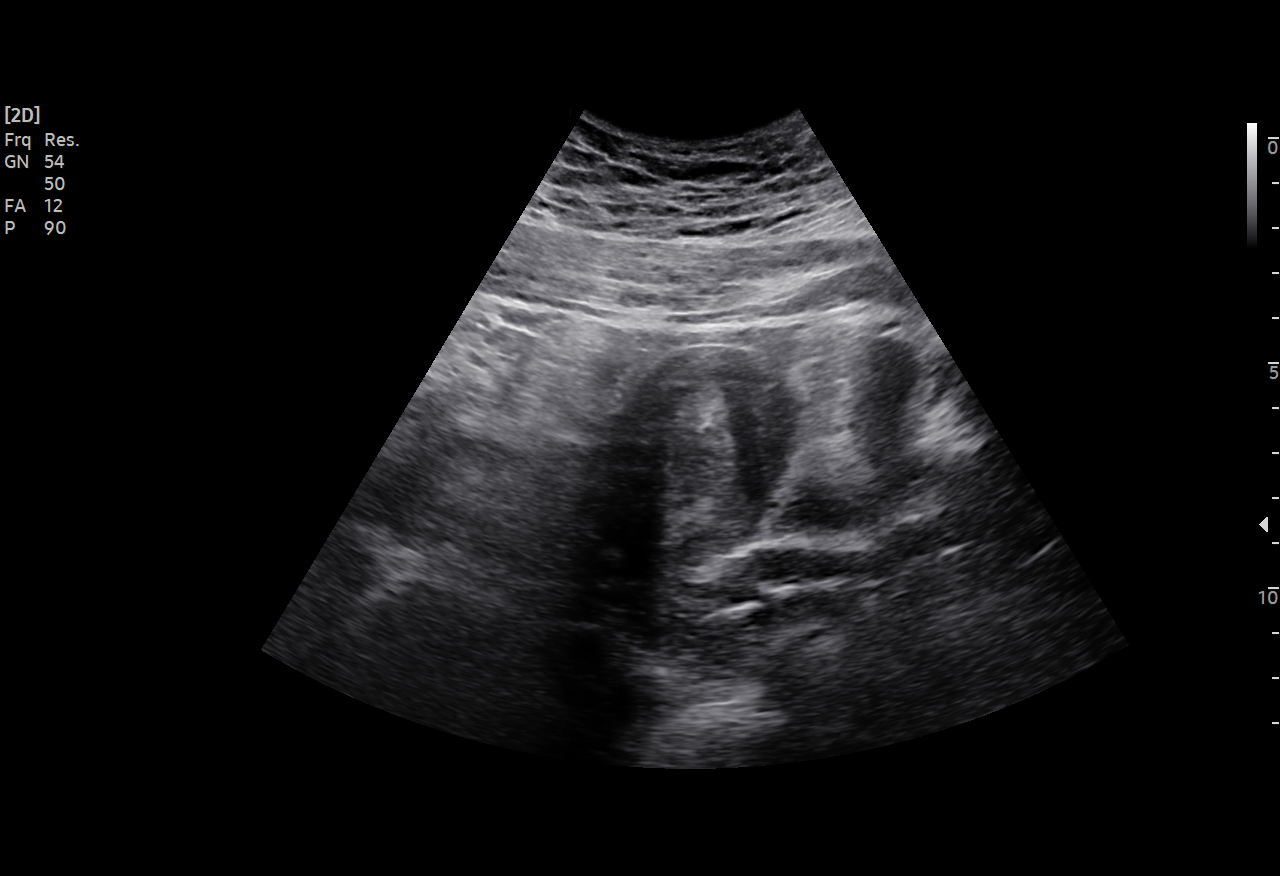

[1 of 1 positions shown; findings below may reference images not displayed]

FINDINGS: Intrauterine gestational sac: None

Yolk sac:  Not Visualized.

Embryo:  Not Visualized.

Cardiac Activity: Not Visualized.

Heart Rate: N/A bpm

Maternal uterus/adnexae: The endometrium is thickened (1.81 cm) and
heterogeneous in appearance.

The right ovary is visualized and is normal in appearance.

A corpus luteum cyst is suspected within the left ovary.

No pelvic free fluid is identified.
IMPRESSION: Thickened, heterogeneous endometrium without evidence of an
intrauterine pregnancy. The presence of blood products within the
endometrial canal cannot be excluded.

## 2022-06-08 NOTE — L&D Delivery Note (Signed)
Operative Note  PROCEDURE DATE: 05/15/23   PREOPERATIVE DIAGNOSIS: Arrest of dilation, failure to progress, chronic hypertension with superimposed preeclampsia   POSTOPERATIVE DIAGNOSIS: The same   PROCEDURE:    Primary Low Transverse Cesarean Section   SURGEON:  Dr. Nilda Simmer Assistant: Dr. Wyn Forster  An experienced assistant was required given the standard of surgical care given the complexity of the case.  This assistant was needed for exposure, dissection, suctioning, retraction, instrument exchange, assisting with delivery with administration of fundal pressure, and for overall help during the procedure.    INDICATIONS: This is a 32 y.o. yo G2P0 at [redacted]w[redacted]d requiring cesarean section secondary to failure to progress during induction for chronic hypertension with superimposed severe preeclampsia.   Decision made to proceed with LTCS. The risks of cesarean section discussed with the patient included but were not limited to: bleeding which may require transfusion or reoperation; infection which may require antibiotics; injury to bowel, bladder, ureters or other surrounding organs; injury to the fetus; need for additional procedures including hysterectomy in the event of a life-threatening hemorrhage; placental abnormalities wth subsequent pregnancies, incisional problems, thromboembolic phenomenon and other postoperative/anesthesia complications. The patient agreed with the proposed plan, giving informed consent for the procedure.     FINDINGS:  Viable female infant in cephalic presentation, occiput posterior, nuchal cord x 1,  APGARs per NICU,  Weight pending, Amniotic fluid clear,  Intact placenta, three vessel cord.  Grossly normal uterus  .   ANESTHESIA:    Epidural ESTIMATED BLOOD LOSS: Per record SPECIMENS: Placenta for routine COMPLICATIONS: None immediate    PROCEDURE IN DETAIL:  The patient received intravenous antibiotics (3g Ancef 500 mg azithromycin) and had sequential  compression devices applied to her lower extremities while in the preoperative area.  She was then taken to the operating room where epidural anesthesia was dosed up to surgical level and was found to be adequate. She was then placed in a dorsal supine position with a leftward tilt, and prepped and draped in a sterile manner.  A foley catheter was placed into her bladder and attached to constant gravity.  After an adequate timeout was performed, a Pfannenstiel skin incision was made with scalpel and carried through to the underlying layer of fascia. The fascia was incised in the midline and this incision was extended bilaterally using the Mayo scissors. Kocher clamps were applied to the superior aspect of the fascial incision and the underlying rectus muscles were dissected off bluntly. A similar process was carried out on the inferior aspect of the facial incision. The rectus muscles were separated in the midline bluntly and the peritoneum was entered bluntly.  A bladder flap was created sharply and developed bluntly. A transverse hysterotomy was made with a scalpel and extended bilaterally bluntly. The bladder blade was then removed. The infant was successfully delivered, and cord was clamped and cut and infant was handed over to awaiting neonatology team. Uterine massage was then administered and the placenta delivered intact with three-vessel cord. There was not enough blood in cord for cord gases. The uterus was cleared of clot and debris.  The hysterotomy was closed with 0 vicryl.  A second imbricating suture of 0-vicryl was used to reinforce the incision and aid in hemostasis.The fascia was closed with 0-PDS in a running fashion with good restoration of anatomy.  The subcutaneus tissue was irrigated and was reapproximated using running plain gut.  The skin was closed with 4-0 Vicryl in a subcuticular fashion.  All surgical site  and was hemostatic at end of procedure without any further bleeding on exam.     Pt tolerated the procedure well. All sponge/lap/needle counts were correct  X 2. Pt taken to recovery room in stable condition.     Nilda Simmer MD

## 2022-08-20 ENCOUNTER — Ambulatory Visit: Payer: BLUE CROSS/BLUE SHIELD | Admitting: Internal Medicine

## 2022-08-24 ENCOUNTER — Ambulatory Visit: Payer: BLUE CROSS/BLUE SHIELD | Admitting: Internal Medicine

## 2022-08-24 ENCOUNTER — Encounter: Payer: Self-pay | Admitting: Internal Medicine

## 2022-08-24 VITALS — BP 136/84 | HR 60 | Temp 96.8°F | Ht 68.0 in | Wt 235.0 lb

## 2022-08-24 DIAGNOSIS — E66811 Obesity, class 1: Secondary | ICD-10-CM | POA: Insufficient documentation

## 2022-08-24 DIAGNOSIS — R7309 Other abnormal glucose: Secondary | ICD-10-CM | POA: Diagnosis not present

## 2022-08-24 DIAGNOSIS — I10 Essential (primary) hypertension: Secondary | ICD-10-CM

## 2022-08-24 DIAGNOSIS — Z1159 Encounter for screening for other viral diseases: Secondary | ICD-10-CM | POA: Diagnosis not present

## 2022-08-24 DIAGNOSIS — E282 Polycystic ovarian syndrome: Secondary | ICD-10-CM

## 2022-08-24 DIAGNOSIS — Z6835 Body mass index (BMI) 35.0-35.9, adult: Secondary | ICD-10-CM

## 2022-08-24 DIAGNOSIS — Z0001 Encounter for general adult medical examination with abnormal findings: Secondary | ICD-10-CM

## 2022-08-24 DIAGNOSIS — E6609 Other obesity due to excess calories: Secondary | ICD-10-CM | POA: Insufficient documentation

## 2022-08-24 DIAGNOSIS — N979 Female infertility, unspecified: Secondary | ICD-10-CM

## 2022-08-24 MED ORDER — HYDROCHLOROTHIAZIDE 12.5 MG PO TABS: 12.5000 mg | ORAL_TABLET | Freq: Every day | ORAL | 3 refills | Status: DC

## 2022-08-24 NOTE — Patient Instructions (Signed)

## 2022-08-24 NOTE — Assessment & Plan Note (Signed)
Encouraged diet and exercise for weight loss ?

## 2022-08-24 NOTE — Assessment & Plan Note (Addendum)
She would like to get restarted on Metformin Referral to infertility placed

## 2022-08-24 NOTE — Assessment & Plan Note (Signed)
Controlled on HCTZ Reinforced DASH diet and exercise weight loss C-Met today 

## 2022-08-24 NOTE — Progress Notes (Signed)
Subjective:    Patient ID: [name], female    DOB: [birth year], 32 y.o.   MRN: [id number]  HPI  Patient presents to clinic today for her annual exam.  She is also due to follow-up chronic conditions.  HTN: Her BP today is 136/84.  She is not taking HCTZ as prescribed because she has been out of her blood pressure medication.  ECG from 11/2018 reviewed.  PCOS: She reports she has regular periods.  She is not currently taking metformin or contraceptives.  She has been trying to conceive for 2 years without success.  She would like referral to an infertility center.  Flu: never Tetanus: > 10 years ago COVID: never Pap smear: 03/2020 Dentist: as needed  Diet: She does eat meat. She consumes fruits and veggies. She tries to avoid fried foods. She drinks mostly water, sweet tea. Exercise: Walking  Review of Systems     Past Medical History:  Diagnosis Date   Hx of abnormal cervical Pap smear    Pt with several normal paps since abnormal pap   Hypertension    Irregular periods    history    Current Outpatient Medications  Medication Sig Dispense Refill   fluconazole (DIFLUCAN) 150 MG tablet Take one tablet by mouth on Day 1. Repeat dose 2nd tablet on Day 3. 2 tablet 0   hydrochlorothiazide (HYDRODIURIL) 12.5 MG tablet Take 1 tablet (12.5 mg total) by mouth daily. 90 tablet 3   ibuprofen (ADVIL) 800 MG tablet Take 1 tablet (800 mg total) by mouth every 8 (eight) hours as needed. 30 tablet 0   metroNIDAZOLE (FLAGYL) 500 MG tablet Take 1 tablet (500 mg total) by mouth 2 (two) times daily. Do not drink alcohol while taking this medicine. 14 tablet 0   norethindrone (MICRONOR) 0.35 MG tablet Take 1 tablet (0.35 mg total) by mouth daily. (Patient not taking: Reported on 05/06/2021) 1 Package 2   No current facility-administered medications for this visit.    No Known Allergies  Family History  Problem Relation Age of Onset   Hypertension Mother    Stroke Mother         mini stroke?   Thyroid disease Mother        thyroid removed   Hypertension Sister     Social History   Socioeconomic History   Marital status: Single    Spouse name: Not on file   Number of children: Not on file   Years of education: Not on file   Highest education level: Not on file  Occupational History   Not on file  Tobacco Use   Smoking status: Former    Packs/day: 0.50    Years: 7.00    Additional pack years: 0.00    Total pack years: 3.50    Types: Cigarettes    Quit date: 07/05/2014    Years since quitting: 8.1   Smokeless tobacco: Never  Vaping Use   Vaping Use: Never used  Substance and Sexual Activity   Alcohol use: Yes    Comment: seldom   Drug use: Yes    Types: Marijuana   Sexual activity: Yes    Partners: Male    Birth control/protection: None  Other Topics Concern   Not on file  Social History Narrative   Not on file   Social Determinants of Health   Financial Resource Strain: Not on file  Food Insecurity: Not on file  Transportation Needs: Not on file  Physical Activity: Inactive (07/05/2017)  Exercise Vital Sign    Days of Exercise per Week: 0 days    Minutes of Exercise per Session: 0 min  Stress: Not on file  Social Connections: Not on file  Intimate Partner Violence: Not on file     Constitutional: Denies fever, malaise, fatigue, headache or abrupt weight changes.  HEENT: Denies eye pain, eye redness, ear pain, ringing in the ears, wax buildup, runny nose, nasal congestion, bloody nose, or sore throat. Respiratory: Denies difficulty breathing, shortness of breath, cough or sputum production.   Cardiovascular: Denies chest pain, chest tightness, palpitations or swelling in the hands or feet.  Gastrointestinal: Pt reports intermittent constipation. Denies abdominal pain, bloating,  diarrhea or blood in the stool.  GU: Patient reports difficulty conceiving.  Denies urgency, frequency, pain with urination, burning sensation, blood in  urine, odor or discharge. Musculoskeletal: Denies decrease in range of motion, difficulty with gait, muscle pain or joint pain and swelling.  Skin: Denies redness, rashes, lesions or ulcercations.  Neurological: Denies dizziness, difficulty with memory, difficulty with speech or problems with balance and coordination.  Psych: Denies anxiety, depression, SI/HI.  No other specific complaints in a complete review of systems (except as listed in HPI above).  Objective:   Physical Exam  BP 136/84 (BP Location: Left Arm, Patient Position: Sitting, Cuff Size: Large)   Pulse 60   Temp (!) 96.8 F (36 C) (Temporal)   Ht 5\' 8"  (1.727 m)   Wt 235 lb (106.6 kg)   SpO2 100%   BMI 35.73 kg/m   Wt Readings from Last 3 Encounters:  05/06/21 217 lb 9.6 oz (98.7 kg)  12/27/20 218 lb 11.2 oz (99.2 kg)  03/27/20 220 lb 3.2 oz (99.9 kg)    General: Appears her stated age, obese, in NAD. Skin: Warm, dry and intact. No rashes noted.  Excessive hair noted on chin. HEENT: Head: normal shape and size; Eyes: sclera white, no icterus, conjunctiva pink, PERRLA and EOMs intact;  Neck:  Neck supple, trachea midline. No masses, lumps present.  Thyromegaly noted. Cardiovascular: Normal rate and rhythm. S1,S2 noted.  No murmur, rubs or gallops noted. No JVD or BLE edema.  Pulmonary/Chest: Normal effort and positive vesicular breath sounds. No respiratory distress. No wheezes, rales or ronchi noted.  Abdomen: Soft and nontender. Normal bowel sounds. No distention or masses noted. Liver, spleen and kidneys non palpable. Musculoskeletal: Strength 5/5 BUE/BLE.  No difficulty with gait.  Neurological: Alert and oriented. Cranial nerves II-XII grossly intact. Coordination normal.  Psychiatric: Mood and affect normal. Behavior is normal. Judgment and thought content normal.     BMET    Component Value Date/Time   NA 134 (L) 12/28/2020 0110   K 2.9 (L) 12/28/2020 0110   CL 101 12/28/2020 0110   CO2 24 12/28/2020  0110   GLUCOSE 118 (H) 12/28/2020 0110   BUN 11 12/28/2020 0110   CREATININE 0.81 12/28/2020 0110   CREATININE 0.77 03/27/2020 1020   CALCIUM 9.1 12/28/2020 0110   GFRNONAA >60 12/28/2020 0110   GFRNONAA 103 03/27/2020 1020   GFRAA 119 03/27/2020 1020    Lipid Panel     Component Value Date/Time   CHOL 159 03/27/2020 1020   CHOL 163 07/05/2017 1028   TRIG 96 03/27/2020 1020   HDL 49 (L) 03/27/2020 1020   HDL 56 07/05/2017 1028   CHOLHDL 3.2 03/27/2020 1020   LDLCALC 91 03/27/2020 1020    CBC    Component Value Date/Time   WBC 12.8 (H)  12/28/2020 0110   RBC 4.38 12/28/2020 0110   HGB 12.5 12/28/2020 0110   HCT 38.1 12/28/2020 0110   PLT 318 12/28/2020 0110   MCV 87.0 12/28/2020 0110   MCH 28.5 12/28/2020 0110   MCHC 32.8 12/28/2020 0110   RDW 15.9 (H) 12/28/2020 0110   LYMPHSABS 2.6 12/28/2020 0110   MONOABS 0.8 12/28/2020 0110   EOSABS 0.6 (H) 12/28/2020 0110   BASOSABS 0.1 12/28/2020 0110    Hgb A1C Lab Results  Component Value Date   HGBA1C 5.4 07/29/2018           Assessment & Plan:   Preventative Health Maintenance:  Encouraged her to get a flu shot in fall Tetanus declined Encouraged her to get her COVID vaccines Pap smear UTD Encouraged her to consume a balanced diet and exercise regimen Advised her to see a dentist annually We will check CBC, c-Met, lipid, A1c and hep C today  RTC in 1 year, sooner if needed Webb Silversmith, NP

## 2022-08-25 LAB — LIPID PANEL
Cholesterol: 190 mg/dL (ref <200)
HDL: 47 mg/dL — ABNORMAL LOW (ref >=50)
LDL Cholesterol (Calc): 118 mg/dL (calc) — ABNORMAL HIGH
Non-HDL Cholesterol (Calc): 143 mg/dL (calc) — ABNORMAL HIGH (ref <130)
Total CHOL/HDL Ratio: 4 (calc) (ref <5.0)
Triglycerides: 132 mg/dL (ref <150)

## 2022-08-25 LAB — HEMOGLOBIN A1C
Hgb A1c MFr Bld: 5.6 % of total Hgb (ref <5.7)
Mean Plasma Glucose: 114 mg/dL
eAG (mmol/L): 6.3 mmol/L

## 2022-08-25 LAB — CBC
HCT: 35.1 % (ref 35.0–45.0)
Hemoglobin: 11.3 g/dL — ABNORMAL LOW (ref 11.7–15.5)
MCH: 28 pg (ref 27.0–33.0)
MCHC: 32.2 g/dL (ref 32.0–36.0)
MCV: 87.1 fL (ref 80.0–100.0)
MPV: 10.2 fL (ref 7.5–12.5)
Platelets: 392 10*3/uL (ref 140–400)
RBC: 4.03 10*6/uL (ref 3.80–5.10)
RDW: 13.7 % (ref 11.0–15.0)
WBC: 7 10*3/uL (ref 3.8–10.8)

## 2022-08-25 LAB — HEPATITIS C ANTIBODY: Hepatitis C Ab: NONREACTIVE

## 2022-08-25 LAB — COMPLETE METABOLIC PANEL WITH GFR
AG Ratio: 1.6 (calc) (ref 1.0–2.5)
ALT: 17 U/L (ref 6–29)
AST: 13 U/L (ref 10–30)
Albumin: 3.9 g/dL (ref 3.6–5.1)
Alkaline phosphatase (APISO): 42 U/L (ref 31–125)
BUN: 7 mg/dL (ref 7–25)
CO2: 24 mmol/L (ref 20–32)
Calcium: 8.9 mg/dL (ref 8.6–10.2)
Chloride: 109 mmol/L (ref 98–110)
Creat: 0.75 mg/dL (ref 0.50–0.97)
Globulin: 2.5 g/dL (calc) (ref 1.9–3.7)
Glucose, Bld: 75 mg/dL (ref 65–99)
Potassium: 4.1 mmol/L (ref 3.5–5.3)
Sodium: 139 mmol/L (ref 135–146)
Total Bilirubin: 0.4 mg/dL (ref 0.2–1.2)
Total Protein: 6.4 g/dL (ref 6.1–8.1)
eGFR: 108 mL/min/{1.73_m2} (ref >=60)

## 2022-10-07 DIAGNOSIS — N911 Secondary amenorrhea: Secondary | ICD-10-CM | POA: Diagnosis not present

## 2022-10-15 DIAGNOSIS — Z3685 Encounter for antenatal screening for Streptococcus B: Secondary | ICD-10-CM | POA: Diagnosis not present

## 2022-10-15 DIAGNOSIS — Z3481 Encounter for supervision of other normal pregnancy, first trimester: Secondary | ICD-10-CM | POA: Diagnosis not present

## 2022-10-15 DIAGNOSIS — Z3401 Encounter for supervision of normal first pregnancy, first trimester: Secondary | ICD-10-CM | POA: Diagnosis not present

## 2022-10-15 LAB — OB RESULTS CONSOLE ANTIBODY SCREEN
Antibody Screen: NEGATIVE
Antibody Screen: NEGATIVE

## 2022-10-15 LAB — OB RESULTS CONSOLE HEPATITIS B SURFACE ANTIGEN: Hepatitis B Surface Ag: NEGATIVE

## 2022-10-15 LAB — OB RESULTS CONSOLE HIV ANTIBODY (ROUTINE TESTING): HIV: NONREACTIVE

## 2022-10-15 LAB — OB RESULTS CONSOLE ABO/RH: RH Type: POSITIVE

## 2022-10-15 LAB — HEPATITIS C ANTIBODY: HCV Ab: NEGATIVE

## 2022-10-15 LAB — OB RESULTS CONSOLE RPR: RPR: NONREACTIVE

## 2022-10-15 LAB — OB RESULTS CONSOLE RUBELLA ANTIBODY, IGM: Rubella: IMMUNE

## 2022-10-29 DIAGNOSIS — Z3400 Encounter for supervision of normal first pregnancy, unspecified trimester: Secondary | ICD-10-CM | POA: Diagnosis not present

## 2022-10-29 DIAGNOSIS — Z113 Encounter for screening for infections with a predominantly sexual mode of transmission: Secondary | ICD-10-CM | POA: Diagnosis not present

## 2022-10-29 DIAGNOSIS — Z1151 Encounter for screening for human papillomavirus (HPV): Secondary | ICD-10-CM | POA: Diagnosis not present

## 2022-10-29 DIAGNOSIS — Z3A10 10 weeks gestation of pregnancy: Secondary | ICD-10-CM | POA: Diagnosis not present

## 2022-10-29 DIAGNOSIS — Z124 Encounter for screening for malignant neoplasm of cervix: Secondary | ICD-10-CM | POA: Diagnosis not present

## 2022-10-29 DIAGNOSIS — Z3481 Encounter for supervision of other normal pregnancy, first trimester: Secondary | ICD-10-CM | POA: Diagnosis not present

## 2022-10-29 LAB — OB RESULTS CONSOLE GC/CHLAMYDIA
Chlamydia: NEGATIVE
Neisseria Gonorrhea: NEGATIVE

## 2022-12-03 DIAGNOSIS — O4692 Antepartum hemorrhage, unspecified, second trimester: Secondary | ICD-10-CM | POA: Diagnosis not present

## 2022-12-03 DIAGNOSIS — Z3A15 15 weeks gestation of pregnancy: Secondary | ICD-10-CM | POA: Diagnosis not present

## 2023-01-04 DIAGNOSIS — Z363 Encounter for antenatal screening for malformations: Secondary | ICD-10-CM | POA: Diagnosis not present

## 2023-01-04 DIAGNOSIS — Z1371 Encounter for nonprocreative screening for genetic disease carrier status: Secondary | ICD-10-CM | POA: Diagnosis not present

## 2023-01-04 DIAGNOSIS — Z3A19 19 weeks gestation of pregnancy: Secondary | ICD-10-CM | POA: Diagnosis not present

## 2023-02-02 DIAGNOSIS — Z362 Encounter for other antenatal screening follow-up: Secondary | ICD-10-CM | POA: Diagnosis not present

## 2023-02-02 DIAGNOSIS — Z3A24 24 weeks gestation of pregnancy: Secondary | ICD-10-CM | POA: Diagnosis not present

## 2023-02-09 DIAGNOSIS — N760 Acute vaginitis: Secondary | ICD-10-CM | POA: Diagnosis not present

## 2023-03-01 DIAGNOSIS — O99891 Other specified diseases and conditions complicating pregnancy: Secondary | ICD-10-CM | POA: Diagnosis not present

## 2023-03-01 DIAGNOSIS — Z3480 Encounter for supervision of other normal pregnancy, unspecified trimester: Secondary | ICD-10-CM | POA: Diagnosis not present

## 2023-03-01 DIAGNOSIS — Z3A27 27 weeks gestation of pregnancy: Secondary | ICD-10-CM | POA: Diagnosis not present

## 2023-04-15 DIAGNOSIS — Z3A34 34 weeks gestation of pregnancy: Secondary | ICD-10-CM | POA: Diagnosis not present

## 2023-04-15 DIAGNOSIS — O368130 Decreased fetal movements, third trimester, not applicable or unspecified: Secondary | ICD-10-CM | POA: Diagnosis not present

## 2023-04-28 DIAGNOSIS — O10013 Pre-existing essential hypertension complicating pregnancy, third trimester: Secondary | ICD-10-CM | POA: Diagnosis not present

## 2023-04-28 DIAGNOSIS — Z3A36 36 weeks gestation of pregnancy: Secondary | ICD-10-CM | POA: Diagnosis not present

## 2023-04-28 DIAGNOSIS — Z3685 Encounter for antenatal screening for Streptococcus B: Secondary | ICD-10-CM | POA: Diagnosis not present

## 2023-04-28 LAB — OB RESULTS CONSOLE GBS: GBS: NEGATIVE

## 2023-05-05 ENCOUNTER — Encounter (HOSPITAL_COMMUNITY): Payer: Self-pay

## 2023-05-05 ENCOUNTER — Inpatient Hospital Stay (HOSPITAL_COMMUNITY)
Admission: AD | Admit: 2023-05-05 | Discharge: 2023-05-05 | Disposition: A | Discharge status: Home / Self Care | Payer: BLUE CROSS/BLUE SHIELD | Attending: Obstetrics and Gynecology | Admitting: Obstetrics and Gynecology

## 2023-05-05 DIAGNOSIS — Z3A37 37 weeks gestation of pregnancy: Secondary | ICD-10-CM | POA: Insufficient documentation

## 2023-05-05 DIAGNOSIS — O10919 Unspecified pre-existing hypertension complicating pregnancy, unspecified trimester: Secondary | ICD-10-CM

## 2023-05-05 DIAGNOSIS — O26893 Other specified pregnancy related conditions, third trimester: Secondary | ICD-10-CM | POA: Insufficient documentation

## 2023-05-05 DIAGNOSIS — O10913 Unspecified pre-existing hypertension complicating pregnancy, third trimester: Secondary | ICD-10-CM | POA: Diagnosis not present

## 2023-05-05 DIAGNOSIS — O10013 Pre-existing essential hypertension complicating pregnancy, third trimester: Secondary | ICD-10-CM | POA: Diagnosis not present

## 2023-05-05 LAB — COMPREHENSIVE METABOLIC PANEL
ALT: 12 U/L (ref 0–44)
AST: 13 U/L — ABNORMAL LOW (ref 15–41)
Albumin: 2.5 g/dL — ABNORMAL LOW (ref 3.5–5.0)
Alkaline Phosphatase: 72 U/L (ref 38–126)
Anion gap: 7 (ref 5–15)
BUN: 7 mg/dL (ref 6–20)
CO2: 20 mmol/L — ABNORMAL LOW (ref 22–32)
Calcium: 8.9 mg/dL (ref 8.9–10.3)
Chloride: 107 mmol/L (ref 98–111)
Creatinine, Ser: 0.58 mg/dL (ref 0.44–1.00)
GFR, Estimated: >60 mL/min (ref >60)
Glucose, Bld: 80 mg/dL (ref 70–99)
Potassium: 3.6 mmol/L (ref 3.5–5.1)
Sodium: 134 mmol/L — ABNORMAL LOW (ref 135–145)
Total Bilirubin: 0.5 mg/dL (ref <1.2)
Total Protein: 5.5 g/dL — ABNORMAL LOW (ref 6.5–8.1)

## 2023-05-05 LAB — CBC
HCT: 35 % — ABNORMAL LOW (ref 36.0–46.0)
Hemoglobin: 11.4 g/dL — ABNORMAL LOW (ref 12.0–15.0)
MCH: 28.8 pg (ref 26.0–34.0)
MCHC: 32.6 g/dL (ref 30.0–36.0)
MCV: 88.4 fL (ref 80.0–100.0)
Platelets: 236 10*3/uL (ref 150–400)
RBC: 3.96 MIL/uL (ref 3.87–5.11)
RDW: 14.3 % (ref 11.5–15.5)
WBC: 8.5 10*3/uL (ref 4.0–10.5)
nRBC: 0 % (ref 0.0–0.2)

## 2023-05-05 LAB — PROTEIN / CREATININE RATIO, URINE
Creatinine, Urine: 93 mg/dL
Protein Creatinine Ratio: 0.13 mg/mg{creat} (ref 0.00–0.15)
Total Protein, Urine: 12 mg/dL

## 2023-05-05 MED ORDER — NIFEDIPINE 10 MG PO CAPS
10.0000 mg | ORAL_CAPSULE | ORAL | Status: DC | PRN
  Administered 2023-05-05: 10 mg via ORAL
  Filled 2023-05-05: qty 1

## 2023-05-05 MED ORDER — NIFEDIPINE 10 MG PO CAPS: 20.0000 mg | ORAL_CAPSULE | ORAL | Status: DC | PRN

## 2023-05-05 MED ORDER — LABETALOL HCL 5 MG/ML IV SOLN: 40.0000 mg | INTRAVENOUS | Status: DC | PRN

## 2023-05-05 MED ORDER — NIFEDIPINE ER OSMOTIC RELEASE 30 MG PO TB24
30.0000 mg | ORAL_TABLET | Freq: Every day | ORAL | Status: DC
  Administered 2023-05-05: 30 mg via ORAL
  Filled 2023-05-05: qty 1

## 2023-05-05 MED ORDER — NIFEDIPINE ER 30 MG PO TB24: 30.0000 mg | ORAL_TABLET | Freq: Every day | ORAL | 0 refills | Status: DC

## 2023-05-05 MED ORDER — NIFEDIPINE 10 MG PO CAPS
20.0000 mg | ORAL_CAPSULE | ORAL | Status: DC | PRN
Start: 2023-05-05 — End: 2023-05-05

## 2023-05-05 NOTE — MAU Note (Signed)
.  [name] is a 32 y.o. at [redacted]w[redacted]d here in MAU reporting: Sent over from office for 140's-150's/90's-100's Bps. Denies HA, visual disturbances, RUQ/epigastric pain, and edema. Denies pain, VB, and LOF. +FM.  CHTN. Was on hydrochlorothiazide prior to pregnancy.   Onset of complaint: Today Pain score: Denies pain.  Vitals:   05/05/23 1650  BP: (!) 169/105  Pulse: 71  Resp: 16  Temp: 98.6 F (37 C)  SpO2: 100%      FHT: 147 initial external Lab orders placed from triage: UA

## 2023-05-05 NOTE — MAU Provider Note (Signed)
Chief Complaint:  Hypertension  HPI  HPI: [name] is a 32 y.o. G2P0010 at 29w1dwho presents to maternity admissions reporting from the office for elevated blood pressures of 140-150s/90-100s. Known hx of CHTN not on medication since pregnancy, previously controlled with hydrochlorothiazide. Asymptomatic. Chronic intermittent floaters which she reports existed prior to pregnancy.  She reports good fetal movement, denies LOF, vaginal bleeding, vaginal itching/burning, urinary symptoms, h/a, dizziness, n/v, diarrhea, constipation or fever/chills.  She denies headache, visual changes or RUQ abdominal pain.   Past Medical History: Past Medical History:  Diagnosis Date   Hx of abnormal cervical Pap smear    Pt with several normal paps since abnormal pap   Hypertension    Irregular periods    history    Past obstetric history: OB History  Gravida Para Term Preterm AB Living  2 0 0 0 1 0  SAB IAB Ectopic Multiple Live Births  1 0 0 0 0    # Outcome Date GA Lbr Len/2nd Weight Sex Type Anes PTL Lv  2 Current           1 SAB             Past Surgical History: Past Surgical History:  Procedure Laterality Date   colposcopy      Family History: Family History  Problem Relation Age of Onset   Hypertension Mother    Stroke Mother        mini stroke?   Thyroid disease Mother        thyroid removed   Dementia Mother        Vasicular Dementia   Hypertension Sister    Hypertension Sister    Dementia Maternal Grandmother    Breast cancer Maternal Aunt     Social History: Social History   Tobacco Use   Smoking status: Former    Current packs/day: 0.00    Average packs/day: 0.5 packs/day for 7.0 years (3.5 ttl pk-yrs)    Types: Cigarettes    Start date: 07/06/2007    Quit date: 07/05/2014    Years since quitting: 8.8   Smokeless tobacco: Never  Vaping Use   Vaping status: Former  Substance Use Topics   Alcohol use: Not Currently    Comment: seldom   Drug use: Not  Currently    Allergies: No Known Allergies  Meds:  Medications Prior to Admission  Medication Sig Dispense Refill Last Dose   Prenatal Vit-Fe Fumarate-FA (PRENATAL MULTIVITAMIN) TABS tablet Take 1 tablet by mouth daily at 12 noon.   05/05/2023   valACYclovir (VALTREX) 500 MG tablet Take 500 mg by mouth 2 (two) times daily.   05/05/2023   hydrochlorothiazide (HYDRODIURIL) 12.5 MG tablet Take 1 tablet (12.5 mg total) by mouth daily. 90 tablet 3    ibuprofen (ADVIL) 800 MG tablet Take 1 tablet (800 mg total) by mouth every 8 (eight) hours as needed. 30 tablet 0     I have reviewed patient's Past Medical Hx, Surgical Hx, Family Hx, Social Hx, medications and allergies.   ROS:  Review of Systems Other systems negative  Physical Exam  Patient Vitals for the past 24 hrs:  BP Temp Temp src Pulse Resp SpO2 Height Weight  05/05/23 1921 138/70 -- -- 74 -- -- -- --  05/05/23 1912 -- -- -- -- -- 100 % -- --  05/05/23 1901 (!) 146/88 -- -- 81 -- -- -- --  05/05/23 1850 135/82 -- -- 68 -- -- -- --  05/05/23 1830 Marland Kitchen)  140/68 -- -- 65 -- -- -- --  05/05/23 1800 (!) 160/93 -- -- 65 -- -- -- --  05/05/23 1745 (!) 147/84 -- -- 68 -- -- -- --  05/05/23 1730 (!) 154/83 -- -- 76 -- -- -- --  05/05/23 1650 (!) 169/105 98.6 F (37 C) Oral 71 16 100 % 5\' 8"  (1.727 m) 121.1 kg   Constitutional: Well-developed, well-nourished female in no acute distress.  Cardiovascular: normal rate and rhythm Respiratory: normal effort, clear to auscultation bilaterally GI: Abd soft, non-tender, gravid appropriate for gestational age.   No rebound or guarding. MS: Extremities nontender, no edema, normal ROM Neurologic: Alert and oriented x 4.  GU: Neg CVAT.    FHT:  Baseline 145, moderate variability, accelerations present, no decelerations Contractions: irregular unpainful   Labs: Results for orders placed or performed during the hospital encounter of 05/05/23 (from the past 24 hour(s))  Protein / creatinine  ratio, urine     Status: None   Collection Time: 05/05/23  5:30 PM  Result Value Ref Range   Creatinine, Urine 93 mg/dL   Total Protein, Urine 12 mg/dL   Protein Creatinine Ratio 0.13 0.00 - 0.15 mg/mg[Cre]  Comprehensive metabolic panel     Status: Abnormal   Collection Time: 05/05/23  5:32 PM  Result Value Ref Range   Sodium 134 (L) 135 - 145 mmol/L   Potassium 3.6 3.5 - 5.1 mmol/L   Chloride 107 98 - 111 mmol/L   CO2 20 (L) 22 - 32 mmol/L   Glucose, Bld 80 70 - 99 mg/dL   BUN 7 6 - 20 mg/dL   Creatinine, Ser 4.09 0.44 - 1.00 mg/dL   Calcium 8.9 8.9 - 81.1 mg/dL   Total Protein 5.5 (L) 6.5 - 8.1 g/dL   Albumin 2.5 (L) 3.5 - 5.0 g/dL   AST 13 (L) 15 - 41 U/L   ALT 12 0 - 44 U/L   Alkaline Phosphatase 72 38 - 126 U/L   Total Bilirubin 0.5 <1.2 mg/dL   GFR, Estimated >91 >47 mL/min   Anion gap 7 5 - 15  CBC     Status: Abnormal   Collection Time: 05/05/23  5:32 PM  Result Value Ref Range   WBC 8.5 4.0 - 10.5 K/uL   RBC 3.96 3.87 - 5.11 MIL/uL   Hemoglobin 11.4 (L) 12.0 - 15.0 g/dL   HCT 82.9 (L) 56.2 - 13.0 %   MCV 88.4 80.0 - 100.0 fL   MCH 28.8 26.0 - 34.0 pg   MCHC 32.6 30.0 - 36.0 g/dL   RDW 86.5 78.4 - 69.6 %   Platelets 236 150 - 400 K/uL   nRBC 0.0 0.0 - 0.2 %      Imaging:  No results found.  MAU Course/MDM: I have reviewed the triage vital signs and the nursing notes.   Pertinent labs & imaging results that were available during my care of the patient were reviewed by me and considered in my medical decision making (see chart for details).      I have reviewed her medical records including past results, notes and treatments.   I have ordered labs and reviewed results.  NST reviewed  Treatments in MAU included CBC, CMP, Serial BP, Urine pro/cr, NST.    Assessment: 1. [redacted] weeks gestation of pregnancy   2. Chronic hypertension affecting pregnancy   Two severe range pressures corrected with 10 mg Procardia. Preeclampsia labs wnl. Asymptomatic. BPP today in  office 8/8 Discussed  with Dr. Lorane Gell Trial XL 30 mg Procardia and continue to monitor for another 90 minutes with BP every 20 minutes, if all mild range ok to discharge home with close follow up, 30 mg Procardia daily and strict preeclampsia return precautions.   BP remains mild range. Pt asymptomatic.   Plan: Discharge home Labor and preeclampsia precautions and fetal kick counts Follow up in Office for prenatal visits and recheck within 1 week with continued home BP monitoring daily   Follow-up Information     Lyn Henri, MD Follow up in 1 week(s).   Specialty: Obstetrics and Gynecology Contact information: [address] [place] [address] [phone]                Pt stable at time of discharge.  Wyn Forster, MD FMOB Fellow, Faculty practice [facility], [facility]  05/05/2023 7:58 PM

## 2023-05-06 ENCOUNTER — Encounter (HOSPITAL_COMMUNITY): Payer: Self-pay | Admitting: Obstetrics and Gynecology

## 2023-05-06 ENCOUNTER — Inpatient Hospital Stay (HOSPITAL_COMMUNITY)
Admission: AD | Admit: 2023-05-06 | Discharge: 2023-05-06 | Disposition: A | Discharge status: Home / Self Care | Payer: BLUE CROSS/BLUE SHIELD | Attending: Obstetrics and Gynecology | Admitting: Obstetrics and Gynecology

## 2023-05-06 DIAGNOSIS — R519 Headache, unspecified: Secondary | ICD-10-CM | POA: Diagnosis not present

## 2023-05-06 DIAGNOSIS — O10913 Unspecified pre-existing hypertension complicating pregnancy, third trimester: Secondary | ICD-10-CM | POA: Insufficient documentation

## 2023-05-06 DIAGNOSIS — Z3A37 37 weeks gestation of pregnancy: Secondary | ICD-10-CM | POA: Diagnosis not present

## 2023-05-06 DIAGNOSIS — O26893 Other specified pregnancy related conditions, third trimester: Secondary | ICD-10-CM

## 2023-05-06 LAB — PROTEIN / CREATININE RATIO, URINE
Creatinine, Urine: 83 mg/dL
Protein Creatinine Ratio: 0.18 mg/mg{creat} — ABNORMAL HIGH (ref 0.00–0.15)
Total Protein, Urine: 15 mg/dL

## 2023-05-06 LAB — COMPREHENSIVE METABOLIC PANEL
ALT: 13 U/L (ref 0–44)
AST: 14 U/L — ABNORMAL LOW (ref 15–41)
Albumin: 2.8 g/dL — ABNORMAL LOW (ref 3.5–5.0)
Alkaline Phosphatase: 86 U/L (ref 38–126)
Anion gap: 9 (ref 5–15)
BUN: <5 mg/dL — ABNORMAL LOW (ref 6–20)
CO2: 20 mmol/L — ABNORMAL LOW (ref 22–32)
Calcium: 8.8 mg/dL — ABNORMAL LOW (ref 8.9–10.3)
Chloride: 106 mmol/L (ref 98–111)
Creatinine, Ser: 0.65 mg/dL (ref 0.44–1.00)
GFR, Estimated: >60 mL/min (ref >60)
Glucose, Bld: 81 mg/dL (ref 70–99)
Potassium: 4 mmol/L (ref 3.5–5.1)
Sodium: 135 mmol/L (ref 135–145)
Total Bilirubin: 0.7 mg/dL (ref <1.2)
Total Protein: 6.2 g/dL — ABNORMAL LOW (ref 6.5–8.1)

## 2023-05-06 LAB — CBC
HCT: 38.5 % (ref 36.0–46.0)
Hemoglobin: 12.6 g/dL (ref 12.0–15.0)
MCH: 28.8 pg (ref 26.0–34.0)
MCHC: 32.7 g/dL (ref 30.0–36.0)
MCV: 88.1 fL (ref 80.0–100.0)
Platelets: 262 10*3/uL (ref 150–400)
RBC: 4.37 MIL/uL (ref 3.87–5.11)
RDW: 14.1 % (ref 11.5–15.5)
WBC: 8.8 10*3/uL (ref 4.0–10.5)
nRBC: 0 % (ref 0.0–0.2)

## 2023-05-06 MED ORDER — LABETALOL HCL 200 MG PO TABS
200.0000 mg | ORAL_TABLET | Freq: Two times a day (BID) | ORAL | 0 refills | Status: DC
Start: 2023-05-06 — End: 2023-05-21

## 2023-05-06 MED ORDER — CYCLOBENZAPRINE HCL 5 MG PO TABS
10.0000 mg | ORAL_TABLET | Freq: Once | ORAL | Status: AC
  Administered 2023-05-06: 10 mg via ORAL
  Filled 2023-05-06: qty 2

## 2023-05-06 MED ORDER — ACETAMINOPHEN-CAFFEINE 500-65 MG PO TABS
2.0000 | ORAL_TABLET | Freq: Once | ORAL | Status: AC
  Administered 2023-05-06: 2 via ORAL
  Filled 2023-05-06: qty 2

## 2023-05-06 MED ORDER — CYCLOBENZAPRINE HCL 10 MG PO TABS: 10.0000 mg | ORAL_TABLET | Freq: Two times a day (BID) | ORAL | 0 refills | Status: DC | PRN

## 2023-05-06 MED ORDER — LABETALOL HCL 100 MG PO TABS
200.0000 mg | ORAL_TABLET | Freq: Once | ORAL | Status: AC
  Administered 2023-05-06: 200 mg via ORAL
  Filled 2023-05-06: qty 2

## 2023-05-06 NOTE — Discharge Instructions (Signed)
   Purchase this when you go pick up your prescriptions. This is the [facility] brand of Excedrin Tension Headache that you were given here. It is the SAME medication, but CHEAPER. You can take 2 caplets every 8 hours as you need it for headache.

## 2023-05-06 NOTE — MAU Note (Addendum)
Pt is a G2P0 at 37.2 weeks with new onset HA and hypertension.  Was seen in MAU last night for hypertension, given procardia and sent home.  Pt now has HA that is not relieved by tylenol taken at 0430. Has not picked up prescription for procardia and has only taken single dose given yesterday at ~2000.  Reports floaters with HA, no clonus, +2 reflexes, +2 edema on LE

## 2023-05-06 NOTE — MAU Provider Note (Signed)
History     CSN: [id number]  Arrival date and time: 05/06/23 4034   Event Date/Time   First Provider Initiated Contact with Patient 05/06/23 0908      Chief Complaint  Patient presents with   Hypertension   HPI Ms. [name] is a 32 y.o. year old G10P0010 female at [redacted]w[redacted]d weeks gestation who presents to MAU reporting new onset of headache at 1230 this morning.  This patient was seen in MAU last night for chronic hypertension rule out preeclampsia.  She was given a dose of Procardia before she was discharged and prescribed Procardia.  She did not pick up her prescription for Procardia because the pharmacy was closed when she left.  She reports that she has taken Tylenol for headache and the headache is not relieved with Tylenol.  The last dose of Tylenol was at 4:30 AM the patient also complains of floaters with her headache and swelling in her lower extremities. She receives [facility]; next appt is 05/10/2023. Her spouse is present and contributing to the history taking. Eating breakfast at the time of assessment.  OB History     Gravida  2   Para  0   Term  0   Preterm  0   AB  1   Living  0      SAB  1   IAB  0   Ectopic  0   Multiple  0   Live Births  0           Past Medical History:  Diagnosis Date   Hx of abnormal cervical Pap smear    Pt with several normal paps since abnormal pap   Hypertension    Irregular periods    history    Past Surgical History:  Procedure Laterality Date   colposcopy      Family History  Problem Relation Age of Onset   Hypertension Mother    Stroke Mother        mini stroke?   Thyroid disease Mother        thyroid removed   Dementia Mother        Vasicular Dementia   Hypertension Sister    Hypertension Sister    Dementia Maternal Grandmother    Breast cancer Maternal Aunt     Social History   Tobacco Use   Smoking status: Former    Current packs/day: 0.00    Average packs/day:  0.5 packs/day for 7.0 years (3.5 ttl pk-yrs)    Types: Cigarettes    Start date: 07/06/2007    Quit date: 07/05/2014    Years since quitting: 8.8   Smokeless tobacco: Never  Vaping Use   Vaping status: Former  Substance Use Topics   Alcohol use: Not Currently    Comment: seldom   Drug use: Not Currently    Allergies: No Known Allergies  No medications prior to admission.    Review of Systems  Constitutional: Negative.   HENT: Negative.    Eyes: Negative.   Respiratory: Negative.    Cardiovascular:  Positive for leg swelling.  Gastrointestinal: Negative.   Endocrine: Negative.   Genitourinary: Negative.   Musculoskeletal: Negative.   Skin: Negative.   Allergic/Immunologic: Negative.   Neurological:  Positive for headaches.  Hematological: Negative.   Psychiatric/Behavioral: Negative.     Physical Exam  Patient Vitals for the past 24 hrs:  BP Temp Temp src Pulse Resp  05/06/23 1216 -- 98.6 F (37 C) Oral --  19  05/06/23 1146 (!) 142/78 -- -- 86 --  05/06/23 1131 (!) 144/74 -- -- 81 --  05/06/23 1116 (!) 142/68 -- -- 83 --  05/06/23 1101 (!) 140/80 -- -- 79 --  05/06/23 1046 (!) 151/99 -- -- 85 --  05/06/23 1031 (!) 150/79 -- -- 83 --  05/06/23 1016 (!) 151/95 -- -- 80 --  05/06/23 1001 138/85 -- -- 91 --  05/06/23 0946 (!) 140/84 -- -- 86 --  05/06/23 0931 (!) 147/100 -- -- 92 --  05/06/23 0916 (!) 157/100 -- -- 89 --  05/06/23 0901 (!) 141/82 -- -- 86 --  05/06/23 0846 (!) 142/88 -- -- 94 --  05/06/23 0835 -- 97.7 F (36.5 C) Oral -- 18  05/06/23 0830 (!) 157/110 -- -- (!) 106 --   Physical Exam Vitals and nursing note reviewed.  Constitutional:      Appearance: Normal appearance. She is obese.  HENT:     Head: Normocephalic and atraumatic.  Cardiovascular:     Rate and Rhythm: Normal rate and regular rhythm.     Pulses: Normal pulses.     Heart sounds: Normal heart sounds.  Pulmonary:     Effort: Pulmonary effort is normal.     Breath sounds: Normal  breath sounds.  Abdominal:     General: Bowel sounds are normal.     Palpations: Abdomen is soft.  Musculoskeletal:        General: Normal range of motion.  Skin:    General: Skin is warm and dry.  Neurological:     Mental Status: She is alert and oriented to person, place, and time.  Psychiatric:        Mood and Affect: Mood normal.        Behavior: Behavior normal.        Thought Content: Thought content normal.        Judgment: Judgment normal.     MAU Course  Procedures  MDM CCUA CBC CMP P/C Ratio Serial BP's  Excedrin  Results for orders placed or performed during the hospital encounter of 05/06/23 (from the past 24 hour(s))  CBC     Status: None   Collection Time: 05/06/23  9:15 AM  Result Value Ref Range   WBC 8.8 4.0 - 10.5 K/uL   RBC 4.37 3.87 - 5.11 MIL/uL   Hemoglobin 12.6 12.0 - 15.0 g/dL   HCT 57.8 46.9 - 62.9 %   MCV 88.1 80.0 - 100.0 fL   MCH 28.8 26.0 - 34.0 pg   MCHC 32.7 30.0 - 36.0 g/dL   RDW 52.8 41.3 - 24.4 %   Platelets 262 150 - 400 K/uL   nRBC 0.0 0.0 - 0.2 %  Comprehensive metabolic panel     Status: Abnormal   Collection Time: 05/06/23  9:15 AM  Result Value Ref Range   Sodium 135 135 - 145 mmol/L   Potassium 4.0 3.5 - 5.1 mmol/L   Chloride 106 98 - 111 mmol/L   CO2 20 (L) 22 - 32 mmol/L   Glucose, Bld 81 70 - 99 mg/dL   BUN <5 (L) 6 - 20 mg/dL   Creatinine, Ser 0.10 0.44 - 1.00 mg/dL   Calcium 8.8 (L) 8.9 - 10.3 mg/dL   Total Protein 6.2 (L) 6.5 - 8.1 g/dL   Albumin 2.8 (L) 3.5 - 5.0 g/dL   AST 14 (L) 15 - 41 U/L   ALT 13 0 - 44 U/L   Alkaline Phosphatase 86  38 - 126 U/L   Total Bilirubin 0.7 <1.2 mg/dL   GFR, Estimated >41 >32 mL/min   Anion gap 9 5 - 15  Protein / creatinine ratio, urine     Status: Abnormal   Collection Time: 05/06/23  9:22 AM  Result Value Ref Range   Creatinine, Urine 83 mg/dL   Total Protein, Urine 15 mg/dL   Protein Creatinine Ratio 0.18 (H) 0.00 - 0.15 mg/mg[Cre]     Assessment and Plan  1.  Chronic hypertension complicating or reason for care during pregnancy, third trimester - Advised to not pick up the Procardia Rx d/t the rebound H/A - prescription for: Labetalol 200 mg po BID  2. Headache in pregnancy, antepartum, third trimester - prescription for: Flexeril 10 mg po BID prn H/A - Can take along with Tylenol - Advised to buy store brand Excedrin Tension H/A OTC and take as advised on the bottle  3. [redacted] weeks gestation of pregnancy   - Discharge patient - Keep scheduled appt with [facility] on 05/10/2023 - Patient verbalized an understanding of the plan of care and agrees.   Raelyn Mora, CNM 05/06/2023, 9:08 AM

## 2023-05-11 ENCOUNTER — Encounter (HOSPITAL_COMMUNITY): Payer: Self-pay | Admitting: *Deleted

## 2023-05-11 ENCOUNTER — Telehealth (HOSPITAL_COMMUNITY): Payer: Self-pay | Admitting: *Deleted

## 2023-05-11 NOTE — Telephone Encounter (Signed)
Preadmission screen  

## 2023-05-12 ENCOUNTER — Other Ambulatory Visit: Payer: Self-pay | Admitting: Advanced Practice Midwife

## 2023-05-13 ENCOUNTER — Other Ambulatory Visit: Payer: Self-pay

## 2023-05-13 ENCOUNTER — Encounter (HOSPITAL_COMMUNITY): Payer: Self-pay | Admitting: Obstetrics and Gynecology

## 2023-05-13 ENCOUNTER — Inpatient Hospital Stay (HOSPITAL_COMMUNITY)
Admission: RE | Admit: 2023-05-13 | Discharge: 2023-05-21 | DRG: 787 | Disposition: A | Discharge status: Home / Self Care | Payer: BLUE CROSS/BLUE SHIELD | Attending: Obstetrics and Gynecology | Admitting: Obstetrics and Gynecology

## 2023-05-13 DIAGNOSIS — Z23 Encounter for immunization: Secondary | ICD-10-CM | POA: Diagnosis not present

## 2023-05-13 DIAGNOSIS — Z823 Family history of stroke: Secondary | ICD-10-CM | POA: Diagnosis not present

## 2023-05-13 DIAGNOSIS — Z87891 Personal history of nicotine dependence: Secondary | ICD-10-CM | POA: Diagnosis not present

## 2023-05-13 DIAGNOSIS — Z8249 Family history of ischemic heart disease and other diseases of the circulatory system: Secondary | ICD-10-CM | POA: Diagnosis not present

## 2023-05-13 DIAGNOSIS — O1092 Unspecified pre-existing hypertension complicating childbirth: Secondary | ICD-10-CM | POA: Diagnosis present

## 2023-05-13 DIAGNOSIS — O1414 Severe pre-eclampsia complicating childbirth: Secondary | ICD-10-CM | POA: Diagnosis not present

## 2023-05-13 DIAGNOSIS — O99214 Obesity complicating childbirth: Secondary | ICD-10-CM | POA: Diagnosis present

## 2023-05-13 DIAGNOSIS — O114 Pre-existing hypertension with pre-eclampsia, complicating childbirth: Principal | ICD-10-CM | POA: Diagnosis present

## 2023-05-13 DIAGNOSIS — I10 Essential (primary) hypertension: Principal | ICD-10-CM | POA: Diagnosis present

## 2023-05-13 DIAGNOSIS — A6000 Herpesviral infection of urogenital system, unspecified: Secondary | ICD-10-CM | POA: Diagnosis present

## 2023-05-13 DIAGNOSIS — Z3A38 38 weeks gestation of pregnancy: Secondary | ICD-10-CM | POA: Diagnosis not present

## 2023-05-13 DIAGNOSIS — Z3A00 Weeks of gestation of pregnancy not specified: Secondary | ICD-10-CM | POA: Diagnosis not present

## 2023-05-13 DIAGNOSIS — O9832 Other infections with a predominantly sexual mode of transmission complicating childbirth: Secondary | ICD-10-CM | POA: Diagnosis present

## 2023-05-13 DIAGNOSIS — Z051 Observation and evaluation of newborn for suspected infectious condition ruled out: Secondary | ICD-10-CM | POA: Diagnosis not present

## 2023-05-13 LAB — COMPREHENSIVE METABOLIC PANEL
ALT: 14 U/L (ref 0–44)
AST: 14 U/L — ABNORMAL LOW (ref 15–41)
Albumin: 2.5 g/dL — ABNORMAL LOW (ref 3.5–5.0)
Alkaline Phosphatase: 80 U/L (ref 38–126)
Anion gap: 8 (ref 5–15)
BUN: 8 mg/dL (ref 6–20)
CO2: 21 mmol/L — ABNORMAL LOW (ref 22–32)
Calcium: 9 mg/dL (ref 8.9–10.3)
Chloride: 108 mmol/L (ref 98–111)
Creatinine, Ser: 0.74 mg/dL (ref 0.44–1.00)
GFR, Estimated: >60 mL/min (ref >60)
Glucose, Bld: 82 mg/dL (ref 70–99)
Potassium: 4 mmol/L (ref 3.5–5.1)
Sodium: 137 mmol/L (ref 135–145)
Total Bilirubin: 0.5 mg/dL (ref <1.2)
Total Protein: 5.4 g/dL — ABNORMAL LOW (ref 6.5–8.1)

## 2023-05-13 LAB — PROTEIN / CREATININE RATIO, URINE
Creatinine, Urine: 66 mg/dL
Protein Creatinine Ratio: 0.17 mg/mg{creat} — ABNORMAL HIGH (ref 0.00–0.15)
Total Protein, Urine: 11 mg/dL

## 2023-05-13 LAB — CBC
HCT: 35.7 % — ABNORMAL LOW (ref 36.0–46.0)
Hemoglobin: 11.9 g/dL — ABNORMAL LOW (ref 12.0–15.0)
MCH: 29.7 pg (ref 26.0–34.0)
MCHC: 33.3 g/dL (ref 30.0–36.0)
MCV: 89 fL (ref 80.0–100.0)
Platelets: 229 10*3/uL (ref 150–400)
RBC: 4.01 MIL/uL (ref 3.87–5.11)
RDW: 14.5 % (ref 11.5–15.5)
WBC: 10 10*3/uL (ref 4.0–10.5)
nRBC: 0 % (ref 0.0–0.2)

## 2023-05-13 LAB — TYPE AND SCREEN
ABO/RH(D): A POS
Antibody Screen: NEGATIVE

## 2023-05-13 MED ORDER — MAGNESIUM SULFATE BOLUS VIA INFUSION
4.0000 g | Freq: Once | INTRAVENOUS | Status: AC
Start: 2023-05-13 — End: 2023-05-13
  Administered 2023-05-13: 4 g via INTRAVENOUS
  Filled 2023-05-13: qty 1000

## 2023-05-13 MED ORDER — MISOPROSTOL 50MCG HALF TABLET
50.0000 ug | ORAL_TABLET | ORAL | Status: DC
  Administered 2023-05-13 – 2023-05-14 (×3): 50 ug via BUCCAL
  Filled 2023-05-13 (×3): qty 1

## 2023-05-13 MED ORDER — LACTATED RINGERS IV SOLN
500.0000 mL | INTRAVENOUS | Status: AC | PRN
  Administered 2023-05-14: 500 mL via INTRAVENOUS

## 2023-05-13 MED ORDER — ACETAMINOPHEN 325 MG PO TABS
650.0000 mg | ORAL_TABLET | ORAL | Status: DC | PRN
  Administered 2023-05-14 (×3): 650 mg via ORAL
  Filled 2023-05-13 (×3): qty 2

## 2023-05-13 MED ORDER — FENTANYL CITRATE (PF) 100 MCG/2ML IJ SOLN: 50.0000 ug | INTRAMUSCULAR | Status: DC | PRN

## 2023-05-13 MED ORDER — OXYTOCIN BOLUS FROM INFUSION: 333.0000 mL | Freq: Once | INTRAVENOUS | Status: DC

## 2023-05-13 MED ORDER — TERBUTALINE SULFATE 1 MG/ML IJ SOLN
0.2500 mg | Freq: Once | INTRAMUSCULAR | Status: DC | PRN
Start: 2023-05-13 — End: 2023-05-15

## 2023-05-13 MED ORDER — LABETALOL HCL 5 MG/ML IV SOLN
40.0000 mg | INTRAVENOUS | Status: DC | PRN
  Administered 2023-05-13: 40 mg via INTRAVENOUS
  Filled 2023-05-13: qty 8

## 2023-05-13 MED ORDER — MISOPROSTOL 50MCG HALF TABLET: 50.0000 ug | ORAL_TABLET | ORAL | Status: DC

## 2023-05-13 MED ORDER — LACTATED RINGERS IV SOLN: INTRAVENOUS | Status: DC

## 2023-05-13 MED ORDER — LABETALOL HCL 5 MG/ML IV SOLN: 40.0000 mg | INTRAVENOUS | Status: DC | PRN

## 2023-05-13 MED ORDER — LABETALOL HCL 200 MG PO TABS
200.0000 mg | ORAL_TABLET | Freq: Two times a day (BID) | ORAL | Status: DC
  Administered 2023-05-13 – 2023-05-15 (×4): 200 mg via ORAL
  Filled 2023-05-13 (×4): qty 1

## 2023-05-13 MED ORDER — VALACYCLOVIR HCL 500 MG PO TABS
500.0000 mg | ORAL_TABLET | Freq: Two times a day (BID) | ORAL | Status: DC
  Administered 2023-05-13 – 2023-05-15 (×4): 500 mg via ORAL
  Filled 2023-05-13 (×4): qty 1

## 2023-05-13 MED ORDER — ONDANSETRON HCL 4 MG/2ML IJ SOLN: 4.0000 mg | Freq: Four times a day (QID) | INTRAMUSCULAR | Status: DC | PRN

## 2023-05-13 MED ORDER — MAGNESIUM SULFATE 40 GM/1000ML IV SOLN
2.0000 g/h | INTRAVENOUS | Status: DC
  Administered 2023-05-14 – 2023-05-15 (×2): 2 g/h via INTRAVENOUS
  Filled 2023-05-13 (×3): qty 1000

## 2023-05-13 MED ORDER — LABETALOL HCL 5 MG/ML IV SOLN: 80.0000 mg | INTRAVENOUS | Status: DC | PRN

## 2023-05-13 MED ORDER — LABETALOL HCL 5 MG/ML IV SOLN
20.0000 mg | INTRAVENOUS | Status: DC | PRN
  Administered 2023-05-13 – 2023-05-18 (×2): 20 mg via INTRAVENOUS
  Filled 2023-05-13 (×3): qty 4

## 2023-05-13 MED ORDER — LIDOCAINE HCL (PF) 1 % IJ SOLN: 30.0000 mL | INTRAMUSCULAR | Status: DC | PRN

## 2023-05-13 MED ORDER — HYDROXYZINE HCL 50 MG PO TABS: 50.0000 mg | ORAL_TABLET | Freq: Four times a day (QID) | ORAL | Status: DC | PRN

## 2023-05-13 MED ORDER — SOD CITRATE-CITRIC ACID 500-334 MG/5ML PO SOLN
30.0000 mL | ORAL | Status: DC | PRN
  Filled 2023-05-13: qty 30

## 2023-05-13 MED ORDER — OXYTOCIN-SODIUM CHLORIDE 30-0.9 UT/500ML-% IV SOLN: 2.5000 [IU]/h | INTRAVENOUS | Status: DC

## 2023-05-13 MED ORDER — HYDRALAZINE HCL 20 MG/ML IJ SOLN: 10.0000 mg | INTRAMUSCULAR | Status: DC | PRN

## 2023-05-13 MED ORDER — LACTATED RINGERS IV SOLN: INTRAVENOUS | Status: AC

## 2023-05-13 MED ORDER — LABETALOL HCL 5 MG/ML IV SOLN: 20.0000 mg | INTRAVENOUS | Status: DC | PRN

## 2023-05-13 NOTE — H&P (Signed)
[name] is a 32 y.o. female presenting for IOL for chronic HTN - direct admit from the office. Was originally scheduled for 39wga induction, however given chronic hypertension and uptrending blood pressures (mild range in the office today), she was direct admitted for induction today. No preE sxs. Recent growth [place] 11/20 EFW 33%ile 2684g.  Pregnancy otherwise complicated by remote hx of HSV outbreak, on valtrex ppx with no lesions. She was on hydrochlorothiazide prior to pregnancy for hypertension, recently was started on labetalol 200mg  BID.  OB History     Gravida  2   Para  0   Term  0   Preterm  0   AB  1   Living  0      SAB  1   IAB  0   Ectopic  0   Multiple  0   Live Births  0          Past Medical History:  Diagnosis Date   Hx of abnormal cervical Pap smear    Pt with several normal paps since abnormal pap   Hypertension    Irregular periods    history   Vaginal Pap smear, abnormal    Past Surgical History:  Procedure Laterality Date   colposcopy     Family History: family history includes Breast cancer in her maternal aunt; Dementia in her maternal grandmother and mother; Hypertension in her mother, sister, and sister; Stroke in her mother; Thyroid disease in her mother. Social History:  reports that she quit smoking about 8 years ago. Her smoking use included cigarettes. She started smoking about 15 years ago. She has a 3.5 pack-year smoking history. She has never used smokeless tobacco. She reports that she does not currently use alcohol. She reports that she does not currently use drugs.     Maternal Diabetes: No Genetic Screening: Normal Maternal Ultrasounds/Referrals: Normal Fetal Ultrasounds or other Referrals:  None Maternal Substance Abuse:  No Significant Maternal Medications:  Meds include: Other:  labetalol Significant Maternal Lab Results:  Group B Strep negative Number of Prenatal Visits:greater than 3 verified prenatal  visits Other Comments:  None     05/13/2023    7:25 PM 05/13/2023    7:11 PM 05/13/2023    7:01 PM  Vitals with BMI  Systolic 149 143 284  Diastolic 85 88 79  Pulse 82 91 77  Admission vitals pending  Constitutional:      Appearance: Normal appearance.  HENT:     Head: Normocephalic.  Eyes:     Pupils: Pupils are equal, round Cardiovascular:     Rate and Rhythm: Normal rate.    Pulses: Normal pulses.  Abdominal:     General: Abdomen is Gravid, nontender Neurological:     Mental Status: She is alert.   Dilation: 1.5 Effacement (%): Thick Station: Ballotable Exam by:: Lorn Junes, RNC [place] confirms vtx FHT cat 1  Prenatal labs: ABO, Rh: --/--/A POS (12/05 1747) Antibody: NEG (12/05 1747) Rubella: Immune (05/09 0000) RPR: Nonreactive (05/09 0000)  HBsAg: Negative (05/09 0000)  HIV: Non-reactive (05/09 0000)  GBS: Negative/-- (11/20 0000)   Assessment/Plan: 32 yo G2P0 at [redacted]w[redacted]d presenting for IOL for chronic HTN. Cervix unfavorable - buccal cytotec q4hr. Pit/AROM when able. cHTN now with superimposed severe preeclampsia - CBC, CMP wnl. Multiple severe range blood pressures on admission requiring IV lab 20, 40. No preE sxs. Mag for seizure ppx started - 4g bolus, 2g maintenance. Bps now mild range.  Remote hx of  HSV outbreak - has been on prophylactic valtrex, no lesions or prodromal sxs. GBS neg  Tawni Levy 05/13/2023, 7:30 PM

## 2023-05-14 ENCOUNTER — Inpatient Hospital Stay (HOSPITAL_COMMUNITY): Payer: BLUE CROSS/BLUE SHIELD | Admitting: Anesthesiology

## 2023-05-14 ENCOUNTER — Encounter (HOSPITAL_COMMUNITY): Payer: Self-pay | Admitting: Obstetrics and Gynecology

## 2023-05-14 LAB — CBC
HCT: 38.7 % (ref 36.0–46.0)
HCT: 39.6 % (ref 36.0–46.0)
Hemoglobin: 12.6 g/dL (ref 12.0–15.0)
Hemoglobin: 13.3 g/dL (ref 12.0–15.0)
MCH: 29 pg (ref 26.0–34.0)
MCH: 30.1 pg (ref 26.0–34.0)
MCHC: 32.6 g/dL (ref 30.0–36.0)
MCHC: 33.6 g/dL (ref 30.0–36.0)
MCV: 89.2 fL (ref 80.0–100.0)
MCV: 89.6 fL (ref 80.0–100.0)
Platelets: 233 10*3/uL (ref 150–400)
Platelets: 248 10*3/uL (ref 150–400)
RBC: 4.34 MIL/uL (ref 3.87–5.11)
RBC: 4.42 MIL/uL (ref 3.87–5.11)
RDW: 14.5 % (ref 11.5–15.5)
RDW: 14.4 % (ref 11.5–15.5)
WBC: 10.6 10*3/uL — ABNORMAL HIGH (ref 4.0–10.5)
WBC: 10 10*3/uL (ref 4.0–10.5)
nRBC: 0 % (ref 0.0–0.2)
nRBC: 0 % (ref 0.0–0.2)

## 2023-05-14 LAB — COMPREHENSIVE METABOLIC PANEL
ALT: 16 U/L (ref 0–44)
AST: 17 U/L (ref 15–41)
Albumin: 2.6 g/dL — ABNORMAL LOW (ref 3.5–5.0)
Alkaline Phosphatase: 91 U/L (ref 38–126)
Anion gap: 9 (ref 5–15)
BUN: <5 mg/dL — ABNORMAL LOW (ref 6–20)
CO2: 21 mmol/L — ABNORMAL LOW (ref 22–32)
Calcium: 7.6 mg/dL — ABNORMAL LOW (ref 8.9–10.3)
Chloride: 102 mmol/L (ref 98–111)
Creatinine, Ser: 0.72 mg/dL (ref 0.44–1.00)
GFR, Estimated: >60 mL/min (ref >60)
Glucose, Bld: 102 mg/dL — ABNORMAL HIGH (ref 70–99)
Potassium: 3.8 mmol/L (ref 3.5–5.1)
Sodium: 132 mmol/L — ABNORMAL LOW (ref 135–145)
Total Bilirubin: 0.5 mg/dL (ref <1.2)
Total Protein: 6 g/dL — ABNORMAL LOW (ref 6.5–8.1)

## 2023-05-14 LAB — MAGNESIUM: Magnesium: 4.9 mg/dL — ABNORMAL HIGH (ref 1.7–2.4)

## 2023-05-14 LAB — RPR: RPR Ser Ql: NONREACTIVE

## 2023-05-14 MED ORDER — EPHEDRINE 5 MG/ML INJ: 10.0000 mg | INTRAVENOUS | Status: DC | PRN

## 2023-05-14 MED ORDER — DIPHENHYDRAMINE HCL 50 MG/ML IJ SOLN: 12.5000 mg | INTRAMUSCULAR | Status: DC | PRN

## 2023-05-14 MED ORDER — LIDOCAINE HCL (PF) 1 % IJ SOLN
INTRAMUSCULAR | Status: DC | PRN
  Administered 2023-05-14: 8 mL via EPIDURAL

## 2023-05-14 MED ORDER — FENTANYL-BUPIVACAINE-NACL 0.5-0.125-0.9 MG/250ML-% EP SOLN: 12.0000 mL/h | EPIDURAL | Status: DC | PRN

## 2023-05-14 MED ORDER — MISOPROSTOL 25 MCG QUARTER TABLET
25.0000 ug | ORAL_TABLET | Freq: Once | ORAL | Status: AC
  Administered 2023-05-14: 25 ug via VAGINAL
  Filled 2023-05-14: qty 1

## 2023-05-14 MED ORDER — LACTATED RINGERS IV SOLN
500.0000 mL | Freq: Once | INTRAVENOUS | Status: AC
Start: 2023-05-14 — End: 2023-05-15
  Administered 2023-05-14: 500 mL via INTRAVENOUS

## 2023-05-14 MED ORDER — PHENYLEPHRINE 80 MCG/ML (10ML) SYRINGE FOR IV PUSH (FOR BLOOD PRESSURE SUPPORT): 80.0000 ug | PREFILLED_SYRINGE | INTRAVENOUS | Status: DC | PRN

## 2023-05-14 MED ORDER — OXYTOCIN-SODIUM CHLORIDE 30-0.9 UT/500ML-% IV SOLN
1.0000 m[IU]/min | INTRAVENOUS | Status: DC
Start: 2023-05-14 — End: 2023-05-15
  Administered 2023-05-14: 6 m[IU]/min via INTRAVENOUS
  Administered 2023-05-14: 10 m[IU]/min via INTRAVENOUS
  Administered 2023-05-14: 2 m[IU]/min via INTRAVENOUS
  Administered 2023-05-14: 4 m[IU]/min via INTRAVENOUS
  Filled 2023-05-14: qty 500

## 2023-05-14 MED ORDER — FENTANYL-BUPIVACAINE-NACL 0.5-0.125-0.9 MG/250ML-% EP SOLN
12.0000 mL/h | EPIDURAL | Status: DC | PRN
  Administered 2023-05-14: 12 mL/h via EPIDURAL
  Filled 2023-05-14: qty 250

## 2023-05-14 MED ORDER — BUTALBITAL-APAP-CAFFEINE 50-325-40 MG PO TABS
1.0000 | ORAL_TABLET | Freq: Once | ORAL | Status: AC
  Administered 2023-05-14: 1 via ORAL
  Filled 2023-05-14: qty 1

## 2023-05-14 MED ORDER — PHENYLEPHRINE 80 MCG/ML (10ML) SYRINGE FOR IV PUSH (FOR BLOOD PRESSURE SUPPORT)
80.0000 ug | PREFILLED_SYRINGE | INTRAVENOUS | Status: DC | PRN
  Administered 2023-05-15 (×2): 80 ug via INTRAVENOUS
  Filled 2023-05-14: qty 10

## 2023-05-14 MED ORDER — MISOPROSTOL 25 MCG QUARTER TABLET
25.0000 ug | ORAL_TABLET | ORAL | Status: DC
  Administered 2023-05-14: 25 ug via BUCCAL
  Filled 2023-05-14: qty 1

## 2023-05-14 MED ORDER — TERBUTALINE SULFATE 1 MG/ML IJ SOLN: 0.2500 mg | Freq: Once | INTRAMUSCULAR | Status: DC | PRN

## 2023-05-14 MED ORDER — METOCLOPRAMIDE HCL 5 MG/ML IJ SOLN
10.0000 mg | Freq: Once | INTRAMUSCULAR | Status: AC
  Administered 2023-05-14: 10 mg via INTRAVENOUS
  Filled 2023-05-14: qty 2

## 2023-05-14 NOTE — Progress Notes (Signed)
Labor Progress Note  Feeling well, not feeling significant contractions. No preE sxs.  Bps have been normal to mild range. Reflexes have been 2+. No SOB. FHT cat 1. Cervix 2/50/-3 s/p 3 doses of cytotec. We discussed options moving forward. Her last cytotec dose was at 2:45am. We reviewed another dose of cytotec vs. Foley balloon. This check was painful for patient, so we reviewed likely need for pain management prior to FB. She elects for cytotec but understands if no significant progress, we may recommend alternative ripening measures. For cytotec vag, buccal when due.  Jule Economy, MD

## 2023-05-14 NOTE — Anesthesia Procedure Notes (Signed)
Epidural Patient location during procedure: OB Start time: 05/14/2023 10:29 PM End time: 05/14/2023 10:34 PM  Staffing Anesthesiologist: Bethena Midget, MD  Preanesthetic Checklist Completed: patient identified, IV checked, site marked, risks and benefits discussed, surgical consent, monitors and equipment checked, pre-op evaluation and timeout performed  Epidural Patient position: sitting Prep: DuraPrep and site prepped and draped Patient monitoring: continuous pulse ox and blood pressure Approach: midline Location: L3-L4 Injection technique: LOR air  Needle:  Needle type: Tuohy  Needle gauge: 17 G Needle length: 9 cm and 9 Needle insertion depth: 9 cm Catheter type: closed end flexible Catheter size: 19 Gauge Catheter at skin depth: 15 cm Test dose: negative  Assessment Events: blood not aspirated, no cerebrospinal fluid, injection not painful, no injection resistance, no paresthesia and negative IV test

## 2023-05-14 NOTE — Progress Notes (Signed)
Labor Progress Note  Patient is s/p 4th dose of cytotec.  Remains comfortable.   FHT is cat 1 with accels  BP (!) 144/84   Pulse 73   Temp 98.2 F (36.8 C) (Oral)   Resp 18   Tolerating magnesium well and blood pressure is normotensive and mild range.   Continue current management. Discussed possible cervical foley at next check, though exams have been poorly tolerated and may require IV medication or epidural. Otherwise, will trial pitocin.   Nilda Simmer

## 2023-05-14 NOTE — Progress Notes (Signed)
Labor Progress Note  Patient doing well, headache relieved with Fioricet, starting to return, however.   Cervix is essentially unchanged, but head better applied.  AROM performed in standard fashion with return of clear fluid.  IUPC placed without difficulty.   Discussed long ripening stage.  We will continue to titrate pitocin, use IUPC to assess for labor adequacy.  FHT cat 1  Nilda Simmer

## 2023-05-14 NOTE — Anesthesia Preprocedure Evaluation (Addendum)
Anesthesia Evaluation  Patient identified by MRN, date of birth, ID band Patient awake    Reviewed: Allergy & Precautions, H&P , NPO status , Patient's Chart, lab work & pertinent test results, reviewed documented beta blocker date and time   Airway Mallampati: III  TM Distance: >3 FB Neck ROM: full    Dental no notable dental hx. (+) Teeth Intact, Dental Advisory Given   Pulmonary former smoker   Pulmonary exam normal breath sounds clear to auscultation       Cardiovascular hypertension, Pt. on medications and Pt. on home beta blockers Normal cardiovascular exam Rhythm:regular Rate:Normal     Neuro/Psych negative neurological ROS  negative psych ROS   GI/Hepatic negative GI ROS, Neg liver ROS,,,  Endo/Other    Class 4 obesity  Renal/GU negative Renal ROS  negative genitourinary   Musculoskeletal   Abdominal   Peds  Hematology negative hematology ROS (+)   Anesthesia Other Findings   Reproductive/Obstetrics (+) Pregnancy                             Anesthesia Physical Anesthesia Plan  ASA: 3  Anesthesia Plan: Epidural   Post-op Pain Management: Minimal or no pain anticipated   Induction:   PONV Risk Score and Plan: 2 and Treatment may vary due to age or medical condition  Airway Management Planned: Natural Airway and Simple Face Mask  Additional Equipment: None  Intra-op Plan:   Post-operative Plan:   Informed Consent: I have reviewed the patients History and Physical, chart, labs and discussed the procedure including the risks, benefits and alternatives for the proposed anesthesia with the patient or authorized representative who has indicated his/her understanding and acceptance.       Plan Discussed with: Anesthesiologist  Anesthesia Plan Comments:        Anesthesia Quick Evaluation

## 2023-05-15 ENCOUNTER — Encounter (HOSPITAL_COMMUNITY)
Admission: RE | Disposition: A | Discharge status: Home / Self Care | Payer: Self-pay | Source: Home / Self Care | Attending: Obstetrics and Gynecology

## 2023-05-15 ENCOUNTER — Encounter (HOSPITAL_COMMUNITY): Payer: Self-pay | Admitting: Obstetrics and Gynecology

## 2023-05-15 DIAGNOSIS — O1414 Severe pre-eclampsia complicating childbirth: Secondary | ICD-10-CM

## 2023-05-15 DIAGNOSIS — Z3A38 38 weeks gestation of pregnancy: Secondary | ICD-10-CM

## 2023-05-15 LAB — CBC
HCT: 40.9 % (ref 36.0–46.0)
Hemoglobin: 13.4 g/dL (ref 12.0–15.0)
MCH: 29.7 pg (ref 26.0–34.0)
MCHC: 32.8 g/dL (ref 30.0–36.0)
MCV: 90.7 fL (ref 80.0–100.0)
Platelets: 264 10*3/uL (ref 150–400)
RBC: 4.51 MIL/uL (ref 3.87–5.11)
RDW: 14.4 % (ref 11.5–15.5)
WBC: 15.1 10*3/uL — ABNORMAL HIGH (ref 4.0–10.5)
nRBC: 0 % (ref 0.0–0.2)

## 2023-05-15 SURGERY — Surgical Case
Anesthesia: Epidural

## 2023-05-15 MED ORDER — PHENYLEPHRINE 80 MCG/ML (10ML) SYRINGE FOR IV PUSH (FOR BLOOD PRESSURE SUPPORT)
PREFILLED_SYRINGE | INTRAVENOUS | Status: AC
  Filled 2023-05-15: qty 10

## 2023-05-15 MED ORDER — COCONUT OIL OIL
1.0000 | TOPICAL_OIL | Status: DC | PRN
  Administered 2023-05-19: 1 via TOPICAL

## 2023-05-15 MED ORDER — METOCLOPRAMIDE HCL 5 MG/ML IJ SOLN
INTRAMUSCULAR | Status: DC | PRN
  Administered 2023-05-15: 10 mg via INTRAVENOUS

## 2023-05-15 MED ORDER — LIDOCAINE-EPINEPHRINE (PF) 2 %-1:200000 IJ SOLN
INTRAMUSCULAR | Status: AC
  Filled 2023-05-15: qty 20

## 2023-05-15 MED ORDER — SOD CITRATE-CITRIC ACID 500-334 MG/5ML PO SOLN
30.0000 mL | ORAL | Status: AC
  Administered 2023-05-15: 30 mL via ORAL

## 2023-05-15 MED ORDER — WITCH HAZEL-GLYCERIN EX PADS: 1.0000 | MEDICATED_PAD | CUTANEOUS | Status: DC | PRN

## 2023-05-15 MED ORDER — OXYCODONE HCL 5 MG/5ML PO SOLN
5.0000 mg | Freq: Once | ORAL | Status: DC | PRN
Start: 2023-05-15 — End: 2023-05-15

## 2023-05-15 MED ORDER — MORPHINE SULFATE (PF) 0.5 MG/ML IJ SOLN
INTRAMUSCULAR | Status: DC | PRN
  Administered 2023-05-15: 3 mg via EPIDURAL

## 2023-05-15 MED ORDER — IBUPROFEN 600 MG PO TABS
600.0000 mg | ORAL_TABLET | Freq: Four times a day (QID) | ORAL | Status: AC
  Administered 2023-05-16 – 2023-05-18 (×12): 600 mg via ORAL
  Filled 2023-05-15 (×12): qty 1

## 2023-05-15 MED ORDER — SCOPOLAMINE 1 MG/3DAYS TD PT72
MEDICATED_PATCH | TRANSDERMAL | Status: DC | PRN
  Administered 2023-05-15: 1 via TRANSDERMAL

## 2023-05-15 MED ORDER — DEXAMETHASONE SODIUM PHOSPHATE 4 MG/ML IJ SOLN
INTRAMUSCULAR | Status: AC
  Filled 2023-05-15: qty 2

## 2023-05-15 MED ORDER — OXYCODONE HCL 5 MG PO TABS: 5.0000 mg | ORAL_TABLET | Freq: Once | ORAL | Status: DC | PRN

## 2023-05-15 MED ORDER — ACETAMINOPHEN 10 MG/ML IV SOLN
INTRAVENOUS | Status: DC | PRN
  Administered 2023-05-15: 1000 mg via INTRAVENOUS

## 2023-05-15 MED ORDER — PHENYLEPHRINE HCL-NACL 20-0.9 MG/250ML-% IV SOLN
INTRAVENOUS | Status: DC | PRN
  Administered 2023-05-15: 50 ug/min via INTRAVENOUS

## 2023-05-15 MED ORDER — DEXAMETHASONE SODIUM PHOSPHATE 4 MG/ML IJ SOLN
INTRAMUSCULAR | Status: AC
  Filled 2023-05-15: qty 1

## 2023-05-15 MED ORDER — LACTATED RINGERS IV SOLN: INTRAVENOUS | Status: DC | PRN

## 2023-05-15 MED ORDER — SIMETHICONE 80 MG PO CHEW: 80.0000 mg | CHEWABLE_TABLET | ORAL | Status: DC | PRN

## 2023-05-15 MED ORDER — PRENATAL MULTIVITAMIN CH
1.0000 | ORAL_TABLET | Freq: Every day | ORAL | Status: DC
  Administered 2023-05-16 – 2023-05-21 (×6): 1 via ORAL
  Filled 2023-05-15 (×6): qty 1

## 2023-05-15 MED ORDER — TRANEXAMIC ACID-NACL 1000-0.7 MG/100ML-% IV SOLN
INTRAVENOUS | Status: DC | PRN
  Administered 2023-05-15: 1000 mg via INTRAVENOUS

## 2023-05-15 MED ORDER — DEXAMETHASONE SODIUM PHOSPHATE 4 MG/ML IJ SOLN
INTRAMUSCULAR | Status: DC | PRN
  Administered 2023-05-15: 8 mg via INTRAVENOUS

## 2023-05-15 MED ORDER — ONDANSETRON HCL 4 MG/2ML IJ SOLN
INTRAMUSCULAR | Status: DC | PRN
  Administered 2023-05-15: 4 mg via INTRAVENOUS

## 2023-05-15 MED ORDER — OXYTOCIN-SODIUM CHLORIDE 30-0.9 UT/500ML-% IV SOLN: 2.5000 [IU]/h | INTRAVENOUS | Status: AC

## 2023-05-15 MED ORDER — SODIUM CHLORIDE 0.9 % IV SOLN
500.0000 mg | INTRAVENOUS | Status: AC
  Administered 2023-05-15: 500 mg via INTRAVENOUS

## 2023-05-15 MED ORDER — OXYCODONE HCL 5 MG PO TABS
5.0000 mg | ORAL_TABLET | ORAL | Status: DC | PRN
  Administered 2023-05-16: 5 mg via ORAL
  Administered 2023-05-16 – 2023-05-18 (×6): 10 mg via ORAL
  Administered 2023-05-18: 5 mg via ORAL
  Administered 2023-05-18 – 2023-05-20 (×7): 10 mg via ORAL
  Administered 2023-05-20: 5 mg via ORAL
  Administered 2023-05-20 – 2023-05-21 (×2): 10 mg via ORAL
  Administered 2023-05-21: 5 mg via ORAL
  Filled 2023-05-15: qty 1
  Filled 2023-05-15: qty 2
  Filled 2023-05-15: qty 1
  Filled 2023-05-15 (×10): qty 2
  Filled 2023-05-15: qty 1
  Filled 2023-05-15 (×2): qty 2
  Filled 2023-05-15 (×2): qty 1
  Filled 2023-05-15 (×2): qty 2

## 2023-05-15 MED ORDER — FENTANYL CITRATE (PF) 100 MCG/2ML IJ SOLN
INTRAMUSCULAR | Status: AC
  Filled 2023-05-15: qty 2

## 2023-05-15 MED ORDER — SIMETHICONE 80 MG PO CHEW
80.0000 mg | CHEWABLE_TABLET | Freq: Three times a day (TID) | ORAL | Status: DC
  Administered 2023-05-16 – 2023-05-21 (×15): 80 mg via ORAL
  Filled 2023-05-15 (×15): qty 1

## 2023-05-15 MED ORDER — OXYTOCIN-SODIUM CHLORIDE 30-0.9 UT/500ML-% IV SOLN
INTRAVENOUS | Status: AC
  Filled 2023-05-15: qty 500

## 2023-05-15 MED ORDER — ONDANSETRON HCL 4 MG/2ML IJ SOLN: 4.0000 mg | Freq: Once | INTRAMUSCULAR | Status: DC | PRN

## 2023-05-15 MED ORDER — DIPHENHYDRAMINE HCL 25 MG PO CAPS: 25.0000 mg | ORAL_CAPSULE | Freq: Four times a day (QID) | ORAL | Status: DC | PRN

## 2023-05-15 MED ORDER — LACTATED RINGERS IV SOLN: INTRAVENOUS | Status: DC

## 2023-05-15 MED ORDER — ACETAMINOPHEN 10 MG/ML IV SOLN: 1000.0000 mg | Freq: Once | INTRAVENOUS | Status: DC | PRN

## 2023-05-15 MED ORDER — MAGNESIUM SULFATE 40 GM/1000ML IV SOLN
2.0000 g/h | INTRAVENOUS | Status: AC
  Administered 2023-05-15: 2 g/h via INTRAVENOUS
  Filled 2023-05-15: qty 1000

## 2023-05-15 MED ORDER — METOCLOPRAMIDE HCL 5 MG/ML IJ SOLN
INTRAMUSCULAR | Status: AC
Start: 2023-05-15 — End: ?
  Filled 2023-05-15: qty 2

## 2023-05-15 MED ORDER — LACTATED RINGERS IV SOLN: INTRAVENOUS | Status: AC

## 2023-05-15 MED ORDER — CEFAZOLIN IN SODIUM CHLORIDE 3-0.9 GM/100ML-% IV SOLN
3.0000 g | INTRAVENOUS | Status: AC
  Administered 2023-05-15: 3 g via INTRAVENOUS

## 2023-05-15 MED ORDER — SENNOSIDES-DOCUSATE SODIUM 8.6-50 MG PO TABS
2.0000 | ORAL_TABLET | Freq: Every day | ORAL | Status: DC
  Administered 2023-05-16 – 2023-05-21 (×6): 2 via ORAL
  Filled 2023-05-15 (×6): qty 2

## 2023-05-15 MED ORDER — ACETAMINOPHEN 325 MG PO TABS
650.0000 mg | ORAL_TABLET | Freq: Four times a day (QID) | ORAL | Status: DC
  Administered 2023-05-16 – 2023-05-21 (×23): 650 mg via ORAL
  Filled 2023-05-15 (×23): qty 2

## 2023-05-15 MED ORDER — ACETAMINOPHEN 325 MG PO TABS: 650.0000 mg | ORAL_TABLET | ORAL | Status: DC | PRN

## 2023-05-15 MED ORDER — ZOLPIDEM TARTRATE 5 MG PO TABS: 5.0000 mg | ORAL_TABLET | Freq: Every evening | ORAL | Status: DC | PRN

## 2023-05-15 MED ORDER — ONDANSETRON HCL 4 MG/2ML IJ SOLN
INTRAMUSCULAR | Status: AC
  Filled 2023-05-15: qty 2

## 2023-05-15 MED ORDER — MENTHOL 3 MG MT LOZG
1.0000 | LOZENGE | OROMUCOSAL | Status: DC | PRN
  Administered 2023-05-19: 3 mg via ORAL
  Filled 2023-05-15: qty 9

## 2023-05-15 MED ORDER — OXYTOCIN-SODIUM CHLORIDE 30-0.9 UT/500ML-% IV SOLN
INTRAVENOUS | Status: DC | PRN
  Administered 2023-05-15: 400 mL via INTRAVENOUS

## 2023-05-15 MED ORDER — DIBUCAINE (PERIANAL) 1 % EX OINT: 1.0000 | TOPICAL_OINTMENT | CUTANEOUS | Status: DC | PRN

## 2023-05-15 MED ORDER — FENTANYL CITRATE (PF) 100 MCG/2ML IJ SOLN: 25.0000 ug | INTRAMUSCULAR | Status: DC | PRN

## 2023-05-15 MED ORDER — FENTANYL CITRATE (PF) 100 MCG/2ML IJ SOLN
INTRAMUSCULAR | Status: DC | PRN
  Administered 2023-05-15: 100 ug via INTRAVENOUS

## 2023-05-15 MED ORDER — SODIUM BICARBONATE 8.4 % IV SOLN
INTRAVENOUS | Status: DC | PRN
  Administered 2023-05-15 (×2): 5 mL via EPIDURAL

## 2023-05-15 MED ORDER — OXYTOCIN-SODIUM CHLORIDE 30-0.9 UT/500ML-% IV SOLN
INTRAVENOUS | Status: AC
Start: 2023-05-15 — End: ?
  Filled 2023-05-15: qty 500

## 2023-05-15 MED ORDER — MORPHINE SULFATE (PF) 0.5 MG/ML IJ SOLN
INTRAMUSCULAR | Status: AC
  Filled 2023-05-15: qty 10

## 2023-05-15 SURGICAL SUPPLY — 28 items
BENZOIN TINCTURE PRP APPL 2/3 (GAUZE/BANDAGES/DRESSINGS) IMPLANT
CHLORAPREP W/TINT 26 (MISCELLANEOUS) ×2 IMPLANT
CLAMP UMBILICAL CORD (MISCELLANEOUS) ×1 IMPLANT
CLOTH BEACON ORANGE TIMEOUT ST (SAFETY) ×1 IMPLANT
DRSG OPSITE POSTOP 4X10 (GAUZE/BANDAGES/DRESSINGS) ×1 IMPLANT
ELECT REM PT RETURN 9FT ADLT (ELECTROSURGICAL) ×1
ELECTRODE REM PT RTRN 9FT ADLT (ELECTROSURGICAL) ×1 IMPLANT
EXTRACTOR VACUUM M CUP 4 TUBE (SUCTIONS) IMPLANT
GLOVE BIOGEL PI IND STRL 7.0 (GLOVE) ×2 IMPLANT
GLOVE BIOGEL PI IND STRL 7.5 (GLOVE) ×1 IMPLANT
GLOVE ECLIPSE 7.0 STRL STRAW (GLOVE) ×1 IMPLANT
GOWN STRL REUS W/TWL LRG LVL3 (GOWN DISPOSABLE) ×2 IMPLANT
KIT ABG SYR 3ML LUER SLIP (SYRINGE) IMPLANT
MAT PREVALON FULL STRYKER (MISCELLANEOUS) IMPLANT
NDL HYPO 25X5/8 SAFETYGLIDE (NEEDLE) IMPLANT
NEEDLE HYPO 25X5/8 SAFETYGLIDE (NEEDLE) IMPLANT
NS IRRIG 1000ML POUR BTL (IV SOLUTION) ×1 IMPLANT
PACK C SECTION WH (CUSTOM PROCEDURE TRAY) ×1 IMPLANT
PAD OB MATERNITY 4.3X12.25 (PERSONAL CARE ITEMS) ×1 IMPLANT
STRIP CLOSURE SKIN 1/2X4 (GAUZE/BANDAGES/DRESSINGS) IMPLANT
SUT PDS AB 0 CTX 60 (SUTURE) ×2 IMPLANT
SUT PLAIN 0 NONE (SUTURE) IMPLANT
SUT VIC AB 0 CT1 36 (SUTURE) IMPLANT
SUT VIC AB 0 CTX36XBRD ANBCTRL (SUTURE) ×3 IMPLANT
SUT VIC AB 4-0 KS 27 (SUTURE) ×1 IMPLANT
TOWEL OR 17X24 6PK STRL BLUE (TOWEL DISPOSABLE) ×1 IMPLANT
TRAY FOLEY W/BAG SLVR 14FR LF (SET/KITS/TRAYS/PACK) ×1 IMPLANT
WATER STERILE IRR 1000ML POUR (IV SOLUTION) ×1 IMPLANT

## 2023-05-15 NOTE — Anesthesia Postprocedure Evaluation (Signed)
Anesthesia Post Note  Patient: [name]  Procedure(s) Performed: CESAREAN SECTION     Patient location during evaluation: PACU Anesthesia Type: Epidural Level of consciousness: oriented and awake and alert Pain management: pain level controlled Vital Signs Assessment: post-procedure vital signs reviewed and stable Respiratory status: spontaneous breathing, respiratory function stable and patient connected to nasal cannula oxygen Cardiovascular status: blood pressure returned to baseline and stable Postop Assessment: no headache, no backache and no apparent nausea or vomiting Anesthetic complications: no   No notable events documented.  Last Vitals:  Vitals:   05/15/23 1905 05/15/23 1906  BP:    Pulse: 64 65  Resp: 16 20  Temp:    SpO2: 98% 97%    Last Pain:  Vitals:   05/15/23 1900  TempSrc:   PainSc: 0-No pain   Pain Goal:    LLE Motor Response: No movement due to regional block (05/15/23 1900) LLE Sensation: Decreased (05/15/23 1900) RLE Motor Response: No movement due to regional block (05/15/23 1900) RLE Sensation: Decreased (05/15/23 1900)     Epidural/Spinal Function Cutaneous sensation: No Sensation (05/15/23 1845), Patient able to flex knees: No (05/15/23 1845), Patient able to lift hips off bed: No (05/15/23 1845), Back pain beyond tenderness at insertion site: No (05/15/23 1845), Progressively worsening motor and/or sensory loss: No (05/15/23 1845), Bowel and/or bladder incontinence post epidural: No (05/15/23 1845)  Mariann Barter

## 2023-05-15 NOTE — Transfer of Care (Signed)
Immediate Anesthesia Transfer of Care Note  Patient: [name]  Procedure(s) Performed: CESAREAN SECTION  Patient Location: PACU  Anesthesia Type:Epidural  Level of Consciousness: awake, alert , and oriented  Airway & Oxygen Therapy: Patient Spontanous Breathing  Post-op Assessment: Report given to RN and Post -op Vital signs reviewed and stable  Post vital signs: Reviewed and stable  Last Vitals:  Vitals Value Taken Time  BP 143/96 05/15/23 1839  Temp    Pulse 72 05/15/23 1842  Resp 18 05/15/23 1842  SpO2 100 % 05/15/23 1842  Vitals shown include unfiled device data.  Last Pain:  Vitals:   05/15/23 1300  TempSrc:   PainSc: 0-No pain         Complications: No notable events documented.

## 2023-05-15 NOTE — Progress Notes (Signed)
MD discussing cesarean section with pt

## 2023-05-15 NOTE — Lactation Note (Signed)
This note was copied from a baby's chart. Lactation Consultation Note  Patient Name: [name] Date: 05/15/2023 Age:34 hours Reason for consult: Initial assessment;Primapara;1st time breastfeeding;Early term 37-38.6wks;Breastfeeding assistance;RN request  P66- RN requested for LC to assist with latching infant. Infant is now 5 hrs old and has not eaten. LC stimulated infant by changing diaper and placed him on [name]'s right breast in the football hold. After a few attempts, infant latched but would not suck. Infant was alert, so LC hand expressed into infant mouth for stimulation. Infant would suckle a little bit and then stop again. [name]/LC attempting this feeding for 10 minutes before deciding that infant would not nurse.  LC encouraged hand expression and [name] agreed. LC was able to hand express 5 drops of colostrum that were finger fed to infant. [name] wants to primarily breastfeed, but also wants to make sure infant eats so she asked about formula. FOB was taught how to pace feed infant and he drank 8 mL total. Infant seemed satisfied at this time. LC encouraged [name] to always try the breast first before hand expressing. [name] agreed.  [name] had her manual pumps, so LC sized her at 15 mm flange. The hospital does not carry 15 mm flanges, so LC provided 18 mm flanges. LC demonstrated how to use the manual pump. [name] verbalized understanding. LC reviewed CDC milk storage guidelines, LC services handout and feeding infant on cue 8-12x in 24 hrs. LC encouraged [name] to call for further assistance as needed.  Maternal Data Has patient been taught Hand Expression?: Yes Does the patient have breastfeeding experience prior to this delivery?: No  Feeding Mother's Current Feeding Choice: Breast Milk and Formula Nipple Type: Nfant Standard Flow (white)  LATCH Score Latch: Repeated attempts needed to sustain latch, nipple held in mouth throughout feeding, stimulation needed to elicit sucking  reflex.  Audible Swallowing: None  Type of Nipple: Everted at rest and after stimulation  Comfort (Breast/Nipple): Soft / non-tender  Hold (Positioning): Full assist, staff holds infant at breast  LATCH Score: 5   Lactation Tools Discussed/Used Tools: Pump;Flanges Flange Size: 18 Breast pump type: Manual Pump Education: Setup, frequency, and cleaning;Milk Storage Reason for Pumping: [name]'s request Pumping frequency: 15-20 min every 2-3 hrs as needed  Interventions Interventions: Breast feeding basics reviewed;Assisted with latch;Hand express;Breast compression;Adjust position;Support pillows;Position options;Expressed milk;Hand pump;Education;Pace feeding;LC Services brochure  Discharge Discharge Education: Warning signs for feeding baby  Consult Status Consult Status: Follow-up Date: 05/16/23 Follow-up type: In-patient    Dema Severin BS, IBCLC 05/15/2023, 11:19 PM

## 2023-05-15 NOTE — Progress Notes (Signed)
At bedside to discuss plan of care.   We have been unable to titrate adequate contractions and cervix remains unchanged at 4/90/-2.  IUPC is in place.  Discussed arrest of dilation, failure to progress.  Discussed Cesarean delivery and my recommendation.   Patient was counseled on the risks of Cesarean delivery, which include but are not limited to bleeding, infection, damage to nearby organs including bowel/bladder/ureter, need for additional procedure or blood transfusion, and implications for future pregnancy.  Patient agreeable to procedure, all questions answered.   There are multiple OR cases happening, will proceed when able.   Nilda Simmer

## 2023-05-15 NOTE — Progress Notes (Signed)
CSW acknowledged consult and spoke with RN.  RN provided a detailed explanation for  consult. At this time, there is no immediate  safety concerns and House coverage is aware.  Someone from the [facility] team will assess [name] post delivery.    Blaine Hamper, MSW, LCSW Clinical Social Work [phone]

## 2023-05-15 NOTE — Op Note (Signed)
Operative Note  PROCEDURE DATE: 05/15/23   PREOPERATIVE DIAGNOSIS: Arrest of dilation, failure to progress, chronic hypertension with superimposed preeclampsia   POSTOPERATIVE DIAGNOSIS: The same   PROCEDURE:    Primary Low Transverse Cesarean Section   SURGEON:  Dr. Nilda Simmer Assistant: Dr. Wyn Forster  An experienced assistant was required given the standard of surgical care given the complexity of the case.  This assistant was needed for exposure, dissection, suctioning, retraction, instrument exchange, assisting with delivery with administration of fundal pressure, and for overall help during the procedure.    INDICATIONS: This is a 32 y.o. yo G2P0 at [redacted]w[redacted]d requiring cesarean section secondary to failure to progress during induction for chronic hypertension with superimposed severe preeclampsia.   Decision made to proceed with LTCS. The risks of cesarean section discussed with the patient included but were not limited to: bleeding which may require transfusion or reoperation; infection which may require antibiotics; injury to bowel, bladder, ureters or other surrounding organs; injury to the fetus; need for additional procedures including hysterectomy in the event of a life-threatening hemorrhage; placental abnormalities wth subsequent pregnancies, incisional problems, thromboembolic phenomenon and other postoperative/anesthesia complications. The patient agreed with the proposed plan, giving informed consent for the procedure.     FINDINGS:  Viable female infant in cephalic presentation, occiput posterior, nuchal cord x 1,  APGARs per NICU,  Weight pending, Amniotic fluid clear,  Intact placenta, three vessel cord.  Grossly normal uterus  .   ANESTHESIA:    Epidural ESTIMATED BLOOD LOSS: Per record SPECIMENS: Placenta for routine COMPLICATIONS: None immediate    PROCEDURE IN DETAIL:  The patient received intravenous antibiotics (3g Ancef 500 mg azithromycin) and had sequential  compression devices applied to her lower extremities while in the preoperative area.  She was then taken to the operating room where epidural anesthesia was dosed up to surgical level and was found to be adequate. She was then placed in a dorsal supine position with a leftward tilt, and prepped and draped in a sterile manner.  A foley catheter was placed into her bladder and attached to constant gravity.  After an adequate timeout was performed, a Pfannenstiel skin incision was made with scalpel and carried through to the underlying layer of fascia. The fascia was incised in the midline and this incision was extended bilaterally using the Mayo scissors. Kocher clamps were applied to the superior aspect of the fascial incision and the underlying rectus muscles were dissected off bluntly. A similar process was carried out on the inferior aspect of the facial incision. The rectus muscles were separated in the midline bluntly and the peritoneum was entered bluntly.  A bladder flap was created sharply and developed bluntly. A transverse hysterotomy was made with a scalpel and extended bilaterally bluntly. The bladder blade was then removed. The infant was successfully delivered, and cord was clamped and cut and infant was handed over to awaiting neonatology team. Uterine massage was then administered and the placenta delivered intact with three-vessel cord. There was not enough blood in cord for cord gases. The uterus was cleared of clot and debris.  The hysterotomy was closed with 0 vicryl.  A second imbricating suture of 0-vicryl was used to reinforce the incision and aid in hemostasis.The fascia was closed with 0-PDS in a running fashion with good restoration of anatomy.  The subcutaneus tissue was irrigated and was reapproximated using running plain gut.  The skin was closed with 4-0 Vicryl in a subcuticular fashion.  All surgical site  and was hemostatic at end of procedure without any further bleeding on exam.     Pt tolerated the procedure well. All sponge/lap/needle counts were correct  X 2. Pt taken to recovery room in stable condition.     Nilda Simmer MD

## 2023-05-16 LAB — CBC
HCT: 33.5 % — ABNORMAL LOW (ref 36.0–46.0)
Hemoglobin: 11 g/dL — ABNORMAL LOW (ref 12.0–15.0)
MCH: 29.6 pg (ref 26.0–34.0)
MCHC: 32.8 g/dL (ref 30.0–36.0)
MCV: 90.3 fL (ref 80.0–100.0)
Platelets: 243 10*3/uL (ref 150–400)
RBC: 3.71 MIL/uL — ABNORMAL LOW (ref 3.87–5.11)
RDW: 14.5 % (ref 11.5–15.5)
WBC: 12.9 10*3/uL — ABNORMAL HIGH (ref 4.0–10.5)
nRBC: 0 % (ref 0.0–0.2)

## 2023-05-16 NOTE — Lactation Note (Signed)
This note was copied from a baby's chart. Lactation Consultation Note  Patient Name: [name] [id number] Date: 05/16/2023 Age:34 hours Reason for consult: Mother's request;Difficult latch;Early term 37-38.6wks;Infant weight loss (weight loss -1.53%). See [name] MR: HX CHTN and PCOS.  Per [name], she has made latch attempts but infant has not been interested in nursing, mostly been formula feeding infant today but wants to BF infant. LC assisted with [name] latching infant on her right breast using the football hold position, infant was on and off the breast, briefly latched for 3 minutes then became sleepy. LC encouraged [name] to continue to work towards latching infant 1st every feeding and ask for continued latch assistance. LC reviewed hand expression and infant given 5 mls of colostrum. [name] concern she did not have colostrum, she attempted pump twice today. LC reviewed pumping instruction and [name] fitted with 18 mm breast flange, LC saw [name] expressing colostrum and had expressed 6 mls and was still pumping as LC left the room. [name] will supplement infant with her EBM first and then offer formula. [name] knows to offer infant 12 mls or more with this feeding  and if infant does not latch, 15-30 mls or more on day 2 of life. Handout out given "Feeding Guidelines",   MBU RN Ladona Ridgel) was floated to [facility] and will assist [name] with future latches tonight.   Today's current feeding plan:  1- Latch infant 1st every feeding every 2-3 hours, skin to skin. 2- Continue to ask for latch assistance and afterwards supplement infant with any EBM that is pumped first and then formula. 3- If infant does not latch offer intake of 15-30 mls per feeding or more if infant wants it on day 2 of life. 4-Continue to use DEBP every 3 hours for 15 minutes on initial setting.  Maternal Data Has patient been taught Hand Expression?: Yes Does the patient have breastfeeding experience prior to this delivery?:  No  Feeding Mother's Current Feeding Choice: Breast Milk and Formula  LATCH Score Latch: Repeated attempts needed to sustain latch, nipple held in mouth throughout feeding, stimulation needed to elicit sucking reflex. (became sleepy after 3 minutes)  Audible Swallowing: A few with stimulation  Type of Nipple: Everted at rest and after stimulation  Comfort (Breast/Nipple): Soft / non-tender  Hold (Positioning): Assistance needed to correctly position infant at breast and maintain latch.  LATCH Score: 7   Lactation Tools Discussed/Used Tools: Pump Flange Size: 18 Breast pump type: Double-Electric Breast Pump Pump Education: Setup, frequency, and cleaning;Milk Storage Reason for Pumping: infant is not latching well at the breast, to help with breast stimulation and to establish [name]'s milk supply. Pumping frequency: [name] will continue to use DEBP every 3 hours for 15 minutes on inital setting. Pumped volume: 6 mL ([name] was still expressing colostrum when LC left the room.)  Interventions Interventions: Assisted with latch;Skin to skin;Breast compression;Breast massage;Hand express;Support pillows;Adjust position;Position options;Expressed milk;DEBP;Education  Discharge    Consult Status Consult Status: Follow-up Date: 05/17/23 Follow-up type: In-patient    Frederico Hamman 05/16/2023, 8:54 PM

## 2023-05-16 NOTE — Progress Notes (Signed)
Postpartum Progress Note  Postpartum Day 1 s/p primary Cesarean section.  Subjective:  Patient reports no overnight events.  She reports well controlled pain, ambulating without difficulty, voiding spontaneously, tolerating PO.  She reports Positive flatus, Negative BM.  Vaginal bleeding is minimal.  Objective: Blood pressure 104/65, pulse (!) 58, temperature 98.2 F (36.8 C), temperature source Oral, resp. rate 16, height 5\' 8"  (1.727 m), weight 122 kg, SpO2 100%, unknown if currently breastfeeding.  Physical Exam:  General: alert and no distress Lochia: appropriate Abdomen: soft, ATTP Uterine Fundus: firm Incision: dressing in place DVT Evaluation: No evidence of DVT seen on physical exam.  Recent Labs    05/15/23 1913 05/16/23 0519  HGB 13.4 11.0*  HCT 40.9 33.5*    Assessment/Plan: Postpartum Day 1, s/p C-section cHTN with SI Preeclampsia Continue PP mag for 24 hrs post delivery BP well controlled UO adequate overnight Home labetalol held, will restart with uptrending BP Lactation following Baby boy - desires circ. Will perform if cleared by nursery.  Doing well, continue routine postpartum care. Will discontinue Mag at 24 hrs.   LOS: 3 days   Lyn Henri 05/16/2023, 7:51 AM

## 2023-05-16 NOTE — Social Work (Addendum)
CSW acknowledged consult and attempted to meet with [name].  [name] had a female visitor in the room so CSW did not ask about DV/ Verbal abuse. CSW will attempt to meet with [name] at a later time.  CSW provided education regarding the baby blues period vs. perinatal mood disorders, discussed treatment and gave resources for mental health follow up if concerns arise.  CSW recommends self-evaluation during the postpartum time period using the New Mom Checklist from Postpartum Progress and encouraged [name] to contact a medical professional if symptoms are noted at any time.  CSW provided review of Sudden Infant Death Syndrome (SIDS) precautions.    4:08pm CSW spoke with [name] again, [name] stated that there is no abuse, verbal or otherwise. [name] denied any resources.   CSW identifies no further need for intervention and no barriers to discharge at this time.  Jimmy Picket, LCSW Clinical Social Worker

## 2023-05-17 ENCOUNTER — Encounter (HOSPITAL_COMMUNITY): Payer: BLUE CROSS/BLUE SHIELD

## 2023-05-17 ENCOUNTER — Encounter (HOSPITAL_COMMUNITY): Payer: Self-pay | Admitting: Obstetrics and Gynecology

## 2023-05-17 MED ORDER — LABETALOL HCL 200 MG PO TABS
400.0000 mg | ORAL_TABLET | Freq: Three times a day (TID) | ORAL | Status: DC
  Administered 2023-05-17 (×2): 400 mg via ORAL
  Filled 2023-05-17 (×2): qty 2

## 2023-05-17 MED ORDER — LABETALOL HCL 200 MG PO TABS
200.0000 mg | ORAL_TABLET | Freq: Three times a day (TID) | ORAL | Status: DC
  Administered 2023-05-17: 200 mg via ORAL
  Filled 2023-05-17: qty 1

## 2023-05-17 NOTE — Lactation Note (Signed)
This note was copied from a baby's chart. Lactation Consultation Note  Patient Name: [name] [id number] Date: 05/17/2023 Age:34 hours Reason for consult: Follow-up assessment;Primapara;1st time breastfeeding;Late-preterm 34-36.6wks;Infant weight loss  P1- [name] reports that infant is finally nursing on the breast and "doing a great job at it." [name] states that infant had just finished nursing when [facility] entered room. [name] denies experiencing any pain or discomfort when infant nurses. [name] denies experiencing any breast changes either. LC reviewed how we should expect her mature milk to come in anywhere from day 2-7 post partum. LC encouraged [name] to continue latching infant to the breast as often as he allows. LC encouraged [name] to call lactation team if she needs any assistance, or if she starts experiencing pain as infant nurses.  LC reviewed CDC milk storage guidelines, LC services handout, cluster feeding and engorgement/breast care. LC encouraged [name] to call for further assistance as needed.   Maternal Data Has patient been taught Hand Expression?: Yes Does the patient have breastfeeding experience prior to this delivery?: No  Feeding Mother's Current Feeding Choice: Breast Milk and Formula  Lactation Tools Discussed/Used Pump Education: Milk Storage  Interventions Interventions: Breast feeding basics reviewed;Education;LC Services brochure  Discharge Discharge Education: Engorgement and breast care;Warning signs for feeding baby  Consult Status Consult Status: Follow-up Date: 05/18/23 Follow-up type: In-patient    Dema Severin BS, IBCLC 05/17/2023, 7:14 PM

## 2023-05-17 NOTE — Progress Notes (Signed)
Subjective: Postpartum Day 2: Cesarean Delivery Patient reports tolerating PO, + flatus, and no problems voiding.  No HA, vision change, RUQ pain, CP/SOB.  Desires circ.  Objective: Vital signs in last 24 hours: Temp:  [98.3 F (36.8 C)-99.5 F (37.5 C)] 99.5 F (37.5 C) (12/09 0749) Pulse Rate:  [65-89] 78 (12/09 0749) Resp:  [16-18] 18 (12/09 0749) BP: (123-162)/(68-97) 156/87 (12/09 0749) SpO2:  [97 %-100 %] 100 % (12/09 0749)  Physical Exam:  General: alert, cooperative, and appears stated age 32: appropriate Uterine Fundus: firm Incision: healing well, no significant drainage, no dehiscence DVT Evaluation: No evidence of DVT seen on physical exam. Negative Homan's sign. No cords or calf tenderness.  Recent Labs    05/15/23 1913 05/16/23 0519  HGB 13.4 11.0*  HCT 40.9 33.5*    Assessment/Plan: Status post Cesarean section. Doing well postoperatively.  Continue current care. SIPE-s/p magnesium recovery.  Labetalol 200 TID restarted to improved BP control.  Will continue to monitor and titrate prn. Circ-newborn with O2 desats.  Awaiting clearance from Peds.  Mitchel Honour, DO 05/17/2023, 8:53 AM

## 2023-05-17 NOTE — Progress Notes (Signed)
Newborn is cleared for circumcision.  Patient is counseled re: risk of bleeding, infection and scarring.  All questions were answered and patient wishes to proceed.  Mitchel Honour, DO

## 2023-05-18 ENCOUNTER — Inpatient Hospital Stay (HOSPITAL_COMMUNITY): Payer: BLUE CROSS/BLUE SHIELD

## 2023-05-18 MED ORDER — NIFEDIPINE ER OSMOTIC RELEASE 30 MG PO TB24
30.0000 mg | ORAL_TABLET | Freq: Two times a day (BID) | ORAL | Status: DC
Start: 2023-05-18 — End: 2023-05-18
  Filled 2023-05-18: qty 1

## 2023-05-18 MED ORDER — LABETALOL HCL 200 MG PO TABS
600.0000 mg | ORAL_TABLET | Freq: Three times a day (TID) | ORAL | Status: DC
  Administered 2023-05-18 – 2023-05-19 (×3): 600 mg via ORAL
  Filled 2023-05-18 (×3): qty 3

## 2023-05-18 MED ORDER — LABETALOL HCL 200 MG PO TABS
600.0000 mg | ORAL_TABLET | Freq: Three times a day (TID) | ORAL | Status: DC
  Administered 2023-05-18: 600 mg via ORAL
  Filled 2023-05-18: qty 3

## 2023-05-18 MED ORDER — LABETALOL HCL 200 MG PO TABS: 600.0000 mg | ORAL_TABLET | Freq: Three times a day (TID) | ORAL | Status: DC

## 2023-05-18 NOTE — H&P (Signed)
Provider called because pt BP elevated  to severe range 187/105 15 minutes later BP was 184/101, pt complains of no headache, blurry vision, epigastric pain or dizziness. Nurse Labetalol protocol was started.New order of procardia XL 30 mg, pt declined stated "it gave her a severe headache, and ordered labetalol" .10 min after Iv labetalol was given nurse recheck BP and it was 150/91. Provider was called again about pt declining procardia, see new order. Pt is currently resting comfortably, with no complaints of headache, blurry vision, epigastric pain or dizziness.

## 2023-05-18 NOTE — Plan of Care (Signed)
  Problem: Education: Goal: Knowledge of General Education information will improve Description: Including pain rating scale, medication(s)/side effects and non-pharmacologic comfort measures Outcome: Progressing   Problem: Health Behavior/Discharge Planning: Goal: Ability to manage health-related needs will improve Outcome: Progressing   Problem: Clinical Measurements: Goal: Ability to maintain clinical measurements within normal limits will improve Outcome: Progressing Goal: Will remain free from infection Outcome: Progressing Goal: Diagnostic test results will improve Outcome: Progressing Goal: Respiratory complications will improve Outcome: Progressing Goal: Cardiovascular complication will be avoided Outcome: Progressing   Problem: Activity: Goal: Risk for activity intolerance will decrease Outcome: Progressing   Problem: Nutrition: Goal: Adequate nutrition will be maintained Outcome: Progressing   Problem: Coping: Goal: Level of anxiety will decrease Outcome: Progressing   Problem: Elimination: Goal: Will not experience complications related to bowel motility Outcome: Progressing Goal: Will not experience complications related to urinary retention Outcome: Progressing   Problem: Pain Management: Goal: General experience of comfort will improve Outcome: Progressing   Problem: Safety: Goal: Ability to remain free from injury will improve Outcome: Progressing   Problem: Skin Integrity: Goal: Risk for impaired skin integrity will decrease Outcome: Progressing   Problem: Education: Goal: Knowledge of the prescribed therapeutic regimen will improve Outcome: Progressing Goal: Understanding of sexual limitations or changes related to disease process or condition will improve Outcome: Progressing Goal: Individualized Educational Video(s) Outcome: Progressing   Problem: Self-Concept: Goal: Communication of feelings regarding changes in body function or  appearance will improve Outcome: Progressing   Problem: Skin Integrity: Goal: Demonstration of wound healing without infection will improve Outcome: Progressing   Problem: Education: Goal: Knowledge of condition will improve Outcome: Progressing Goal: Individualized Educational Video(s) Outcome: Progressing Goal: Individualized Newborn Educational Video(s) Outcome: Progressing   Problem: Activity: Goal: Will verbalize the importance of balancing activity with adequate rest periods Outcome: Progressing Goal: Ability to tolerate increased activity will improve Outcome: Progressing   Problem: Coping: Goal: Ability to identify and utilize available resources and services will improve Outcome: Progressing   Problem: Life Cycle: Goal: Chance of risk for complications during the postpartum period will decrease Outcome: Progressing   Problem: Role Relationship: Goal: Ability to demonstrate positive interaction with newborn will improve Outcome: Progressing   Problem: Skin Integrity: Goal: Demonstration of wound healing without infection will improve Outcome: Progressing

## 2023-05-18 NOTE — Progress Notes (Signed)
Labetalol was initiated yesterday am per progress note.  Labetalol was titrated up to 400 TID to improved control.  I received a phone call for severe range BPs around MN and ordered IV labetalol protocol; patient received labetalol 20 IV x 1 dose with BP reduced to mild range.  I also ordered Procardia 30 XL every day to be added to labetalol.  Patient refused stating this caused severe HA previously.  Increased labetalol to 600 TID.  Will continue to monitor BPs and increase labetalol prn.   Mitchel Honour, DO

## 2023-05-18 NOTE — Progress Notes (Signed)
Subjective: Postpartum Day 3: Cesarean Delivery Patient reports tolerating PO, + flatus, and no problems voiding.    Objective: Vital signs in last 24 hours: Temp:  [98.7 F (37.1 C)-99.7 F (37.6 C)] 98.7 F (37.1 C) (12/10 0834) Pulse Rate:  [74-93] 78 (12/10 0834) Resp:  [18] 18 (12/10 0834) BP: (139-187)/(81-105) 152/90 (12/10 0834) SpO2:  [100 %] 100 % (12/10 0834)  Physical Exam:  General: alert, cooperative, and no distress Lochia: appropriate Uterine Fundus: firm Incision: healing well DVT Evaluation: No evidence of DVT seen on physical exam.  Recent Labs    05/15/23 1913 05/16/23 0519  HGB 13.4 11.0*  HCT 40.9 33.5*    Assessment/Plan: C/S POD #3-good bowel/bladder function Preeclampsia-S/P magnesium sulfate PP. Labetalol resumed yesterday @ 200mg  TID that was titrated to 400mg  TID. She had early am severe range BP treated with labetalol 20 mg IV. Nifedipine ordered but patient declined. She states she developed debilitating HA after 2 days of nifedipine earlier in pregnancy. Therefore labetalol increased to 600mg  TID. She is getting her first dose of 600mg  now. Observe    Roselle Locus II, MD 05/18/2023, 8:52 AM

## 2023-05-19 LAB — CBC
HCT: 30.2 % — ABNORMAL LOW (ref 36.0–46.0)
Hemoglobin: 9.8 g/dL — ABNORMAL LOW (ref 12.0–15.0)
MCH: 29.6 pg (ref 26.0–34.0)
MCHC: 32.5 g/dL (ref 30.0–36.0)
MCV: 91.2 fL (ref 80.0–100.0)
Platelets: 273 10*3/uL (ref 150–400)
RBC: 3.31 MIL/uL — ABNORMAL LOW (ref 3.87–5.11)
RDW: 14.3 % (ref 11.5–15.5)
WBC: 7.5 10*3/uL (ref 4.0–10.5)
nRBC: 0.3 % — ABNORMAL HIGH (ref 0.0–0.2)

## 2023-05-19 LAB — COMPREHENSIVE METABOLIC PANEL
ALT: 26 U/L (ref 0–44)
AST: 26 U/L (ref 15–41)
Albumin: 2.3 g/dL — ABNORMAL LOW (ref 3.5–5.0)
Alkaline Phosphatase: 51 U/L (ref 38–126)
Anion gap: 5 (ref 5–15)
BUN: 10 mg/dL (ref 6–20)
CO2: 24 mmol/L (ref 22–32)
Calcium: 8.6 mg/dL — ABNORMAL LOW (ref 8.9–10.3)
Chloride: 106 mmol/L (ref 98–111)
Creatinine, Ser: 0.73 mg/dL (ref 0.44–1.00)
GFR, Estimated: >60 mL/min (ref >60)
Glucose, Bld: 78 mg/dL (ref 70–99)
Potassium: 4.5 mmol/L (ref 3.5–5.1)
Sodium: 135 mmol/L (ref 135–145)
Total Bilirubin: 0.5 mg/dL (ref <1.2)
Total Protein: 5.3 g/dL — ABNORMAL LOW (ref 6.5–8.1)

## 2023-05-19 MED ORDER — HYDRALAZINE HCL 25 MG PO TABS
25.0000 mg | ORAL_TABLET | Freq: Four times a day (QID) | ORAL | Status: DC
  Administered 2023-05-19 – 2023-05-21 (×8): 25 mg via ORAL
  Filled 2023-05-19 (×8): qty 1

## 2023-05-19 MED ORDER — HYDRALAZINE HCL 10 MG PO TABS
10.0000 mg | ORAL_TABLET | Freq: Four times a day (QID) | ORAL | Status: DC
Start: 2023-05-19 — End: 2023-05-19
  Administered 2023-05-19 (×2): 10 mg via ORAL
  Filled 2023-05-19 (×2): qty 1

## 2023-05-19 MED ORDER — LABETALOL HCL 200 MG PO TABS
200.0000 mg | ORAL_TABLET | Freq: Once | ORAL | Status: AC
  Administered 2023-05-19: 200 mg via ORAL
  Filled 2023-05-19: qty 1

## 2023-05-19 MED ORDER — LABETALOL HCL 200 MG PO TABS
800.0000 mg | ORAL_TABLET | Freq: Three times a day (TID) | ORAL | Status: DC
  Administered 2023-05-19 – 2023-05-21 (×6): 800 mg via ORAL
  Filled 2023-05-19 (×6): qty 4

## 2023-05-19 MED ORDER — POLYETHYLENE GLYCOL 3350 17 G PO PACK
17.0000 g | PACK | Freq: Every day | ORAL | Status: DC
  Administered 2023-05-19 – 2023-05-21 (×3): 17 g via ORAL
  Filled 2023-05-19 (×3): qty 1

## 2023-05-19 NOTE — Lactation Note (Signed)
This note was copied from a baby's chart. Lactation Consultation Note  Patient Name: [name] Date: 05/19/2023 Age:34 days Reason for consult: Follow-up assessment;Early term 37-38.6wks;1st time breastfeeding;Primapara;Breastfeeding assistance (mom Post MagSo4) Baby woke up on his own , LC changed a wet and stool.  LC had mom ice due to borderline engorgement. And hand expressed and used revers pressure on the right breast to latch the baby after he received a 10 ml appetizer from the bottle ( formula ) due to be to fussy to latch. Depth at the breast achieved and baby fed for 15 mins with multiple swallows and softened the borderline engorged breast down to softness and per mom more comfortable.  Baby STS after he fed and mom aware to pump the other breast down.  LC reviewed supply and demand, importance of staying ahead of the engorgement to prevent engorgement.  Feed with feeding cues and by 3 hours to offer the breast, if to fussy, feed an appetizer of EBM or formula ( 10 ml and then latch.  Important to take the baby to the breast calm.   LC sent a referral for a Stork DEBP at 1800 05/19/23 , and mom aware to check with [facility] or nurse in am to see if it was accepted by [facility].    Maternal Data Has patient been taught Hand Expression?: Yes  Feeding Mother's Current Feeding Choice: Breast Milk and Formula Nipple Type: Slow - flow  LATCH Score Latch: Grasps breast easily, tongue down, lips flanged, rhythmical sucking.  Audible Swallowing: Spontaneous and intermittent  Type of Nipple: Everted at rest and after stimulation (reverse pressure and pre pumping needed)  Comfort (Breast/Nipple): Filling, red/small blisters or bruises, mild/mod discomfort  Hold (Positioning): Assistance needed to correctly position infant at breast and maintain latch.  LATCH Score: 8   Lactation Tools Discussed/Used Tools: Pump;Flanges Flange Size: 18 Breast pump type: Double-Electric  Breast Pump;Manual Pump Education: Milk Storage;Setup, frequency, and cleaning  Interventions Interventions: Breast feeding basics reviewed;Assisted with latch;Skin to skin;Breast massage;Hand express;Pre-pump if needed;Reverse pressure;Breast compression;Adjust position;Support pillows;Position options;Hand pump;DEBP;Education  Discharge Pump: Manual LC sent a Stork DEBP referral at 1800 - 05/19/2023 , and mom aware she will need the DEBP if approved before D/C tomorrow.  Consult Status Consult Status: Follow-up Date: 05/20/23 Follow-up type: In-patient    [name] 05/19/2023, 5:45 PM

## 2023-05-19 NOTE — Progress Notes (Signed)
Brief Progress Note  Feeling well, has had moderate headache for the past hour or so - improving after medications. No RUQ pain or vision changes. Exam remains reassuring. No lower extremity edema noted.  We discussed reassuring CBC/CMP. Bps throughout today remain high 150s/80s-90s after initiation of hydral 10mg  QID this AM. Reviewed increasing dose to 25mg  QID and BP monitoring until at least tomorrow to ensure adequate control. She is in agreement with plan. We did review possibility of restarting hydrochlorothiazide if Bps remain this elevated.  Jule Economy, MD

## 2023-05-19 NOTE — Progress Notes (Signed)
Postpartum Progress Note  S: Feeling overall well, is having some gas pain. No headache, vision changes, RUQ pain. Lochia appropriate. No subjective fevers/chills, Cp, or SOB. Ambulating.   O:     05/19/2023    8:04 AM 05/19/2023    7:51 AM 05/19/2023    4:46 AM  Vitals with BMI  Systolic 161 161 161  Diastolic 100 103 89  Pulse 82 79 75   Gen: NAD, A&O Pulm: NWOB Abd: soft, appropriately ttp, fundus firm and below Umb. Honeycomb incision c/d. Ext: No evidence of DVT, trace edema b/l  Labs Recent Results (from the past 2160 hour(s))  OB RESULT CONSOLE Group B Strep     Status: None   Collection Time: 04/28/23 12:00 AM  Result Value Ref Range   GBS Negative   Protein / creatinine ratio, urine     Status: None   Collection Time: 05/05/23  5:30 PM  Result Value Ref Range   Creatinine, Urine 93 mg/dL   Total Protein, Urine 12 mg/dL    Comment: NO NORMAL RANGE ESTABLISHED FOR THIS TEST   Protein Creatinine Ratio 0.13 0.00 - 0.15 mg/mg[Cre]    Comment: Performed at [facility], [address], [place], [place] [address]  Comprehensive metabolic panel     Status: Abnormal   Collection Time: 05/05/23  5:32 PM  Result Value Ref Range   Sodium 134 (L) 135 - 145 mmol/L   Potassium 3.6 3.5 - 5.1 mmol/L   Chloride 107 98 - 111 mmol/L   CO2 20 (L) 22 - 32 mmol/L   Glucose, Bld 80 70 - 99 mg/dL    Comment: Glucose reference range applies only to samples taken after fasting for at least 8 hours.   BUN 7 6 - 20 mg/dL   Creatinine, Ser 5.40 0.44 - 1.00 mg/dL   Calcium 8.9 8.9 - 98.1 mg/dL   Total Protein 5.5 (L) 6.5 - 8.1 g/dL   Albumin 2.5 (L) 3.5 - 5.0 g/dL   AST 13 (L) 15 - 41 U/L   ALT 12 0 - 44 U/L   Alkaline Phosphatase 72 38 - 126 U/L   Total Bilirubin 0.5 <1.2 mg/dL   GFR, Estimated >19 >14 mL/min    Comment: (NOTE) Calculated using the CKD-EPI Creatinine Equation (2021)    Anion gap 7 5 - 15    Comment: Performed at [facility], [address]  [place], [place] [address]  CBC     Status: Abnormal   Collection Time: 05/05/23  5:32 PM  Result Value Ref Range   WBC 8.5 4.0 - 10.5 K/uL   RBC 3.96 3.87 - 5.11 MIL/uL   Hemoglobin 11.4 (L) 12.0 - 15.0 g/dL   HCT 62.1 (L) 30.8 - 65.7 %   MCV 88.4 80.0 - 100.0 fL   MCH 28.8 26.0 - 34.0 pg   MCHC 32.6 30.0 - 36.0 g/dL   RDW 84.6 96.2 - 95.2 %   Platelets 236 150 - 400 K/uL   nRBC 0.0 0.0 - 0.2 %    Comment: Performed at [facility], [address] [place], [place] [address]  CBC     Status: None   Collection Time: 05/06/23  9:15 AM  Result Value Ref Range   WBC 8.8 4.0 - 10.5 K/uL   RBC 4.37 3.87 - 5.11 MIL/uL   Hemoglobin 12.6 12.0 - 15.0 g/dL   HCT 44.0 10.2 - 72.5 %   MCV 88.1 80.0 -  100.0 fL   MCH 28.8 26.0 - 34.0 pg   MCHC 32.7 30.0 - 36.0 g/dL   RDW 11.9 14.7 - 82.9 %   Platelets 262 150 - 400 K/uL   nRBC 0.0 0.0 - 0.2 %    Comment: Performed at [facility], [address] [place], [place] [address]  Comprehensive metabolic panel     Status: Abnormal   Collection Time: 05/06/23  9:15 AM  Result Value Ref Range   Sodium 135 135 - 145 mmol/L   Potassium 4.0 3.5 - 5.1 mmol/L   Chloride 106 98 - 111 mmol/L   CO2 20 (L) 22 - 32 mmol/L   Glucose, Bld 81 70 - 99 mg/dL    Comment: Glucose reference range applies only to samples taken after fasting for at least 8 hours.   BUN <5 (L) 6 - 20 mg/dL   Creatinine, Ser 0.86 0.44 - 1.00 mg/dL   Calcium 8.8 (L) 8.9 - 10.3 mg/dL   Total Protein 6.2 (L) 6.5 - 8.1 g/dL   Albumin 2.8 (L) 3.5 - 5.0 g/dL   AST 14 (L) 15 - 41 U/L   ALT 13 0 - 44 U/L   Alkaline Phosphatase 86 38 - 126 U/L   Total Bilirubin 0.7 <1.2 mg/dL   GFR, Estimated >57 >84 mL/min    Comment: (NOTE) Calculated using the CKD-EPI Creatinine Equation (2021)    Anion gap 9 5 - 15    Comment: Performed at [facility], [address], [place], [place] [address]  Protein / creatinine ratio, urine     Status: Abnormal   Collection Time: 05/06/23  9:22  AM  Result Value Ref Range   Creatinine, Urine 83 mg/dL   Total Protein, Urine 15 mg/dL    Comment: NO NORMAL RANGE ESTABLISHED FOR THIS TEST   Protein Creatinine Ratio 0.18 (H) 0.00 - 0.15 mg/mg[Cre]    Comment: Performed at [facility] Lab, [address], [place], [place] [address]  CBC     Status: Abnormal   Collection Time: 05/13/23  5:47 PM  Result Value Ref Range   WBC 10.0 4.0 - 10.5 K/uL   RBC 4.01 3.87 - 5.11 MIL/uL   Hemoglobin 11.9 (L) 12.0 - 15.0 g/dL   HCT 32.4 (L) 40.1 - 02.7 %   MCV 89.0 80.0 - 100.0 fL   MCH 29.7 26.0 - 34.0 pg   MCHC 33.3 30.0 - 36.0 g/dL   RDW 25.3 66.4 - 40.3 %   Platelets 229 150 - 400 K/uL   nRBC 0.0 0.0 - 0.2 %    Comment: Performed at [facility], [address], [place], [place] [address]  Type and screen [facility]     Status: None   Collection Time: 05/13/23  5:47 PM  Result Value Ref Range   ABO/RH(D) A POS    Antibody Screen NEG    Sample Expiration      05/16/2023,2359 Performed at [facility], [address] [place], [place] [address]   RPR     Status: None   Collection Time: 05/13/23  5:47 PM  Result Value Ref Range   RPR Ser Ql NON REACTIVE NON REACTIVE    Comment: Performed at [facility], [address] [place], [place] [address]  Comprehensive metabolic panel     Status: Abnormal   Collection Time: 05/13/23  5:47 PM  Result Value Ref Range  Sodium 137 135 - 145 mmol/L   Potassium 4.0 3.5 - 5.1 mmol/L   Chloride 108 98 - 111 mmol/L   CO2 21 (L) 22 - 32 mmol/L   Glucose, Bld 82 70 - 99 mg/dL    Comment: Glucose reference range applies only to samples taken after fasting for at least 8 hours.   BUN 8 6 - 20 mg/dL   Creatinine, Ser 1.61 0.44 - 1.00 mg/dL   Calcium 9.0 8.9 - 09.6 mg/dL   Total Protein 5.4 (L) 6.5 - 8.1 g/dL   Albumin 2.5 (L) 3.5 - 5.0 g/dL   AST 14 (L) 15 - 41 U/L   ALT 14 0 - 44 U/L   Alkaline Phosphatase 80 38 - 126 U/L   Total Bilirubin 0.5 <1.2 mg/dL    GFR, Estimated >04 >54 mL/min    Comment: (NOTE) Calculated using the CKD-EPI Creatinine Equation (2021)    Anion gap 8 5 - 15    Comment: Performed at [facility], [address], [place], [place] [address]  Protein / creatinine ratio, urine     Status: Abnormal   Collection Time: 05/13/23  7:53 PM  Result Value Ref Range   Creatinine, Urine 66 mg/dL   Total Protein, Urine 11 mg/dL    Comment: NO NORMAL RANGE ESTABLISHED FOR THIS TEST   Protein Creatinine Ratio 0.17 (H) 0.00 - 0.15 mg/mg[Cre]    Comment: Performed at [facility], [address] [place], [place] [address]  CBC     Status: Abnormal   Collection Time: 05/14/23  7:31 AM  Result Value Ref Range   WBC 10.6 (H) 4.0 - 10.5 K/uL   RBC 4.34 3.87 - 5.11 MIL/uL   Hemoglobin 12.6 12.0 - 15.0 g/dL   HCT 29.5 62.1 - 30.8 %   MCV 89.2 80.0 - 100.0 fL   MCH 29.0 26.0 - 34.0 pg   MCHC 32.6 30.0 - 36.0 g/dL   RDW 65.7 84.6 - 96.2 %   Platelets 233 150 - 400 K/uL   nRBC 0.0 0.0 - 0.2 %    Comment: Performed at [facility], [address] [place], [place] [address]  CBC     Status: None   Collection Time: 05/14/23  9:19 PM  Result Value Ref Range   WBC 10.0 4.0 - 10.5 K/uL   RBC 4.42 3.87 - 5.11 MIL/uL   Hemoglobin 13.3 12.0 - 15.0 g/dL   HCT 13.2 44.0 - 10.2 %   MCV 89.6 80.0 - 100.0 fL   MCH 30.1 26.0 - 34.0 pg   MCHC 33.6 30.0 - 36.0 g/dL   RDW 72.5 36.6 - 44.0 %   Platelets 248 150 - 400 K/uL   nRBC 0.0 0.0 - 0.2 %    Comment: Performed at [facility], [address] [place], [place] [address]  Comprehensive metabolic panel     Status: Abnormal   Collection Time: 05/14/23  9:19 PM  Result Value Ref Range   Sodium 132 (L) 135 - 145 mmol/L   Potassium 3.8 3.5 - 5.1 mmol/L   Chloride 102 98 - 111 mmol/L   CO2 21 (L) 22 - 32 mmol/L   Glucose, Bld 102 (H) 70 - 99 mg/dL    Comment: Glucose reference range applies only to samples taken after fasting for at least 8 hours.   BUN <5 (L) 6 - 20  mg/dL   Creatinine, Ser 5.95 0.44 - 1.00  mg/dL   Calcium 7.6 (L) 8.9 - 10.3 mg/dL   Total Protein 6.0 (L) 6.5 - 8.1 g/dL   Albumin 2.6 (L) 3.5 - 5.0 g/dL   AST 17 15 - 41 U/L   ALT 16 0 - 44 U/L   Alkaline Phosphatase 91 38 - 126 U/L   Total Bilirubin 0.5 <1.2 mg/dL   GFR, Estimated >46 >96 mL/min    Comment: (NOTE) Calculated using the CKD-EPI Creatinine Equation (2021)    Anion gap 9 5 - 15    Comment: Performed at [facility] Lab, [address] [place], [place] [address]  Magnesium     Status: Abnormal   Collection Time: 05/14/23  9:19 PM  Result Value Ref Range   Magnesium 4.9 (H) 1.7 - 2.4 mg/dL    Comment: Performed at [facility], [address] [place], [place] [address]  CBC     Status: Abnormal   Collection Time: 05/15/23  7:13 PM  Result Value Ref Range   WBC 15.1 (H) 4.0 - 10.5 K/uL   RBC 4.51 3.87 - 5.11 MIL/uL   Hemoglobin 13.4 12.0 - 15.0 g/dL   HCT 40.1 02.7 - 25.3 %   MCV 90.7 80.0 - 100.0 fL   MCH 29.7 26.0 - 34.0 pg   MCHC 32.8 30.0 - 36.0 g/dL   RDW 66.4 40.3 - 47.4 %   Platelets 264 150 - 400 K/uL   nRBC 0.0 0.0 - 0.2 %    Comment: Performed at [facility], [address] [place], [place] [address]  CBC     Status: Abnormal   Collection Time: 05/16/23  5:19 AM  Result Value Ref Range   WBC 12.9 (H) 4.0 - 10.5 K/uL   RBC 3.71 (L) 3.87 - 5.11 MIL/uL   Hemoglobin 11.0 (L) 12.0 - 15.0 g/dL   HCT 38.7 (L) 56.4 - 33.2 %   MCV 90.3 80.0 - 100.0 fL   MCH 29.6 26.0 - 34.0 pg   MCHC 32.8 30.0 - 36.0 g/dL   RDW 95.1 88.4 - 16.6 %   Platelets 243 150 - 400 K/uL   nRBC 0.0 0.0 - 0.2 %    Comment: Performed at [facility], [address] [place], [place] [address]     A/P:  POD4 s/p pCS. AFVSS. Benign exam. Will add on miralax. CHTN with superimposed severe preeclampsia - s/p magnesium for seizure ppx postpartum. Labetalol 600mg  TID yesterday, now with severe range BP and uptrending Bps overall. Will increase to max dose 800mg   TID and add on hydralazine 10mg  QID. No symptoms. Will recheck CBC, CMP today. I discussed with her likely inpatient monitoring for an additional day to ensure Bps well-controlled prior to discharge. Continue present care.   Jule Economy, MD

## 2023-05-20 MED ORDER — HYDROCHLOROTHIAZIDE 12.5 MG PO TABS
12.5000 mg | ORAL_TABLET | Freq: Every day | ORAL | Status: DC
  Administered 2023-05-20 – 2023-05-21 (×2): 12.5 mg via ORAL
  Filled 2023-05-20 (×2): qty 1

## 2023-05-20 NOTE — Lactation Note (Signed)
This note was copied from a baby's chart. Lactation Consultation Note  Patient Name: [name] [id number] Date: 05/20/2023 Age:34 days  Reason for consult: 1st time breastfeeding;Primapara;Follow-up assessment;Other (Comment) (mom being managed for elevated BP)  P1, [redacted]w[redacted]d, .48% weight loss  Mother is still having elevated BP and remains hospitalized for management. She reports she is progressing with latching baby and expressing milk. Mother says that "[name]" latches to the left breast with ease but she is still working on latching him onto the right. She is pumping 100 ml of EBM. Baby is still getting formula.   Discussed the process of milk production, "supply and demand" and the importance of breast stimulation and milk removal in order to make an optimal milk supply.  Discussed mother to breastfeed 8-12 times in 24 hours, skin to skin and breast feed before formula feeding.   If missed feedings at breast or substituting feeding with formula, advised to hand express and/or pump to remove milk from the breast.   Mother verbalized understanding. Has received her stork pump [employer])  for home use. Currently, she is pumping  with the hospital DEBP. Advised to use the expression mode now that her volume has increased.   Feeding Mother's Current Feeding Choice: Breast Milk and Formula Nipple Type: Slow - flow  LATCH Score  Encouraged to call for a feeding assistance     Lactation Tools Discussed/Used Reason for Pumping: establish and maintain milk supply Pumping frequency: every 3 hours Pumped volume: 100 mL  Interventions Interventions: Education  Discharge Pump: DEBP;Personal;Manual (Spectra)  Consult Status Consult Status: Complete Date: 05/21/23 Follow-up type: In-patient    Christella Hartigan M 05/20/2023, 12:54 PM

## 2023-05-20 NOTE — Progress Notes (Signed)
Subjective: Postpartum Day 5: Cesarean Delivery Patient reports tolerating PO, + flatus, and no problems voiding.   Denies PIH sxs but concerned about bps still elevated and wants it managed before d/c Baby doing well  Objective: Vital signs in last 24 hours: Temp:  [98.4 F (36.9 C)-99.4 F (37.4 C)] 99.4 F (37.4 C) (12/12 0803) Pulse Rate:  [81-93] 91 (12/12 0803) Resp:  [17-18] 18 (12/12 0803) BP: (138-166)/(75-102) 151/92 (12/12 0803) SpO2:  [92 %-100 %] 97 % (12/12 0803)  Physical Exam:  General: alert, cooperative, appears stated age, and no distress Lochia: appropriate Uterine Fundus: firm Incision: healing well DVT Evaluation: No evidence of DVT seen on physical exam.  Recent Labs    05/19/23 0924  HGB 9.8*  HCT 30.2*    Assessment/Plan: Status post Cesarean section. Postoperative course complicated by [facility] with SIPE s/p Mag,   BPs still having a couple of severe range despite max labetalol 800mg  TID and Hydralazine Pt reports she did well previously on hydrochlorothiazide and wants to start that Continue hopsitalization for bp management No sxs of worsening SIPE.  Turner Daniels, MD 05/20/2023, 11:54 AM

## 2023-05-21 MED ORDER — OXYCODONE HCL 5 MG PO TABS: 5.0000 mg | ORAL_TABLET | ORAL | 0 refills | Status: DC | PRN

## 2023-05-21 MED ORDER — LABETALOL HCL 200 MG PO TABS: 800.0000 mg | ORAL_TABLET | Freq: Three times a day (TID) | ORAL | 0 refills | Status: DC

## 2023-05-21 MED ORDER — HYDRALAZINE HCL 25 MG PO TABS: 25.0000 mg | ORAL_TABLET | Freq: Four times a day (QID) | ORAL | 0 refills | Status: DC

## 2023-05-21 MED ORDER — HYDROCHLOROTHIAZIDE 12.5 MG PO TABS: 12.5000 mg | ORAL_TABLET | Freq: Every day | ORAL | 0 refills | Status: DC

## 2023-05-21 NOTE — Progress Notes (Signed)
Pt discharged home in stable condition after discharge instructions given. Pt verbalized understanding and all questions were answered. Pt was sent home with all belongings.

## 2023-05-21 NOTE — Plan of Care (Signed)

## 2023-05-21 NOTE — Lactation Note (Signed)
This note was copied from a baby's chart. Lactation Consultation Note  Patient Name: [name] TFTDD'U Date: 05/21/2023 Age:34 days  Reason for consult: Follow-up assessment;Primapara;1st time breastfeeding;Breastfeeding assistance;Early term 37-38.6wks  P1, [redacted]w[redacted]d, regained to birth weight   Mother's milk is in and breast are full and leaking. Mother has been pumping, latching and formula feeding. Assisted mother with latching baby in football hold on the right breast. He was fussy and pulling back. Nipple shield (16 mm) was applied and baby latched well and fed for 10 minutes with audible swallows. Mother has been able to latch baby on the right breast but he was resistant at this feeding. Attempted to latch baby to the left breast with the NS but "[name]" would not latch at this time. Baby was then bottle fed following breastfeeding.   Mother instructed how to apply NS, observe for deep latch, listen for swallows and breast softening once milk is in and follow the feeding to observe for milk in the shield. Mother's nipple should appear rounded and not pinched. Milk was noted in the NS with this feeding.  Advised mother to still pump 6-8 times when using a NS. Mother plans to feed baby by breast and bottle. Discussed an OP Lactation Consult follow up appointment and mother agreeable. Referral made to [facility].  Mom made aware of O/P services, breastfeeding support groups, community resources, and our phone # for post-discharge questions.      Feeding Mother's Current Feeding Choice: Breast Milk and Formula  LATCH Score Latch: Grasps breast easily, tongue down, lips flanged, rhythmical sucking. (with nipple shield)  Audible Swallowing: Spontaneous and intermittent  Type of Nipple: Everted at rest and after stimulation  Comfort (Breast/Nipple): Filling, red/small blisters or bruises, mild/mod discomfort  Hold (Positioning): Assistance needed to correctly  position infant at breast and maintain latch.  LATCH Score: 8   Lactation Tools Discussed/Used Tools: Nipple Shields Nipple shield size: 16  Interventions Interventions: Education;Breast feeding basics reviewed;Assisted with latch;Skin to skin;Hand express;Breast compression;Adjust position;Support pillows  Discharge Discharge Education: Engorgement and breast care;Warning signs for feeding baby Pump: Personal  Consult Status Consult Status: Complete    Omar Person 05/21/2023, 12:34 PM

## 2023-05-21 NOTE — Discharge Summary (Signed)
Postpartum Discharge Summary  Date of Service May 21, 2023     Patient Name: [name] DOB: 30-Jan-1991 MRN: [id number]  Date of admission: 05/13/2023 Delivery date:05/15/2023 Delivering provider: Damaris Hippo A Date of discharge: 05/21/2023  Admitting diagnosis: Chronic hypertension [I10] Intrauterine pregnancy: [redacted]w[redacted]d     Secondary diagnosis:  Principal Problem:   Chronic hypertension  Additional problems: none    Discharge diagnosis: Term Pregnancy Delivered, CHTN, and CHTN with superimposed preeclampsia                                              Post partum procedures: none Augmentation: AROM, Pitocin, and Cytotec Complications: None  Hospital course: Induction of Labor With Cesarean Section   32 y.o. yo G2P1011 at [redacted]w[redacted]d was admitted to the hospital 05/13/2023 for induction of labor. Patient had a labor course significant for chronic htn with preeclampsia . The patient went for cesarean section due to Arrest of Dilation. Delivery details are as follows: Membrane Rupture Time/Date: 7:24 PM,05/14/2023  Delivery Method:C-Section, Low Transverse Operative Delivery:N/A Details of operation can be found in separate operative Note.  Patient had a postpartum course complicated by nothing . She is ambulating, tolerating a regular diet, passing flatus, and urinating well.  Patient is discharged home in stable condition on 05/21/23.      Newborn Data: Birth date:05/15/2023 Birth time:5:26 PM Gender:Female Living status:Living Apgars:1 ,8  Weight:2940 g                               Magnesium Sulfate received: Yes: Seizure prophylaxis BMZ received: No Rhophylac:N/A MMR:N/A T-DaP:Given prenatally Flu: Yes RSV Vaccine received: No Transfusion:No Immunizations administered:  There is no immunization history on file for this patient.  Physical exam  Vitals:   05/20/23 1546 05/20/23 1931 05/20/23 2338 05/21/23 0439  BP: 125/74 (!) 139/94 117/76 139/80  Pulse: 92  (!) 101 98 82  Resp: 18 16 19 16   Temp: 98.2 F (36.8 C) 98.7 F (37.1 C)  98.7 F (37.1 C)  TempSrc: Oral Oral  Oral  SpO2: 96% 98%  97%  Weight:      Height:       General: alert, cooperative, and no distress Lochia: appropriate Uterine Fundus: firm Incision: Healing well with no significant drainage DVT Evaluation: No evidence of DVT seen on physical exam. Labs: Lab Results  Component Value Date   WBC 7.5 05/19/2023   HGB 9.8 (L) 05/19/2023   HCT 30.2 (L) 05/19/2023   MCV 91.2 05/19/2023   PLT 273 05/19/2023      Latest Ref Rng & Units 05/19/2023    9:24 AM  CMP  Glucose 70 - 99 mg/dL 78   BUN 6 - 20 mg/dL 10   Creatinine 4.09 - 1.00 mg/dL 8.11   Sodium 914 - 782 mmol/L 135   Potassium 3.5 - 5.1 mmol/L 4.5   Chloride 98 - 111 mmol/L 106   CO2 22 - 32 mmol/L 24   Calcium 8.9 - 10.3 mg/dL 8.6   Total Protein 6.5 - 8.1 g/dL 5.3   Total Bilirubin <9.5 mg/dL 0.5   Alkaline Phos 38 - 126 U/L 51   AST 15 - 41 U/L 26   ALT 0 - 44 U/L 26    Edinburgh Score:    05/18/2023  2:54 PM  Edinburgh Postnatal Depression Scale Screening Tool  I have been able to laugh and see the funny side of things. 0  I have looked forward with enjoyment to things. 0  I have blamed myself unnecessarily when things went wrong. 1  I have been anxious or worried for no good reason. 1  I have felt scared or panicky for no good reason. 0  Things have been getting on top of me. 0  I have been so unhappy that I have had difficulty sleeping. 0  I have felt sad or miserable. 0  I have been so unhappy that I have been crying. 0  The thought of harming myself has occurred to me. 0  Edinburgh Postnatal Depression Scale Total 2      After visit meds:  Allergies as of 05/21/2023   No Known Allergies      Medication List     STOP taking these medications    valACYclovir 500 MG tablet Commonly known as: VALTREX       TAKE these medications    hydrALAZINE 25 MG tablet Commonly  known as: APRESOLINE Take 1 tablet (25 mg total) by mouth every 6 (six) hours.   hydrochlorothiazide 12.5 MG tablet Commonly known as: HYDRODIURIL Take 1 tablet (12.5 mg total) by mouth daily.   labetalol 200 MG tablet Commonly known as: NORMODYNE Take 4 tablets (800 mg total) by mouth every 8 (eight) hours. What changed:  how much to take when to take this   oxyCODONE 5 MG immediate release tablet Commonly known as: Oxy IR/ROXICODONE Take 1-2 tablets (5-10 mg total) by mouth every 4 (four) hours as needed for moderate pain (pain score 4-6).   prenatal multivitamin Tabs tablet Take 1 tablet by mouth daily at 12 noon.               Discharge Care Instructions  (From admission, onward)           Start     Ordered   05/21/23 0000  No dressing needed        05/21/23 0803             Discharge home in stable condition Infant Feeding: Breast Infant Disposition:home with mother Discharge instruction: per After Visit Summary and Postpartum booklet. Activity: Advance as tolerated. Pelvic rest for 6 weeks.  Diet: routine diet Anticipated Birth Control: Unsure Postpartum Appointment:1 week Additional Postpartum F/U: Incision check 1 week and BP check 1 week Future Appointments: Future Appointments  Date Time Provider Department Center  08/26/2023  3:00 PM Lorre Munroe, NP [facility]   Follow up Visit:      05/21/2023 Jeani Hawking, MD

## 2023-05-24 ENCOUNTER — Telehealth (HOSPITAL_COMMUNITY): Payer: Self-pay | Admitting: *Deleted

## 2023-05-24 NOTE — Telephone Encounter (Signed)
05/24/2023  Name: [name] MRN: [id number] DOB: Dec 13, 1990  Reason for Call:  Transition of Care Hospital Discharge Call  Contact Status: Patient Contact Status: Complete  Language assistant needed:          Follow-Up Questions: Do You Have Any Concerns About Your Health As You Heal From Delivery?: Yes What Concerns Do You Have About Your Health?: Patient asked, "Is it OK for me to take Tylenol for a headache? My paperwork says not to take anything that isn't listed on this paper." Patient rated headache pain as a 3 out o10. Stated she is having them every day. Has not taken any medication for them, but reported that they are relieved with rest. RN reviewed other warning signs to report to MD. Instructed patient to contact her OB in the morning to report her symptoms and to discuss safe medications for her to take. Patient verbalized understanding. No other questions or concerns voiced at this time. Do You Have Any Concerns About Your Infants Health?: No Patient reported infant was seen by pediatrician today. Edinburgh Postnatal Depression Scale:  In the Past 7 Days: I have been able to laugh and see the funny side of things.: As much as I always could I have looked forward with enjoyment to things.: As much as I ever did I have blamed myself unnecessarily when things went wrong.: No, never I have been anxious or worried for no good reason.: Hardly ever I have felt scared or panicky for no good reason.: No, not at all Things have been getting on top of me.: No, most of the time I have coped quite well I have been so unhappy that I have had difficulty sleeping.: Not at all I have felt sad or miserable.: No, not at all I have been so unhappy that I have been crying.: No, never The thought of harming myself has occurred to me.: Never Edinburgh Postnatal Depression Scale Total: 2  PHQ2-9 Depression Scale:     Discharge Follow-up: Edinburgh score requires follow up?: No Patient  was advised of the following resources:: Breastfeeding Support Group, Support Group, Lactation Patient requested RN email contact information for lactation warm-line and scheduling number for outpatient lactation appointment. Email sent. Post-discharge interventions: Reviewed Newborn Safe Sleep Practices  Signature Deforest Hoyles, RN, 05/24/23, [phone]

## 2023-05-26 DIAGNOSIS — D500 Iron deficiency anemia secondary to blood loss (chronic): Secondary | ICD-10-CM | POA: Diagnosis not present

## 2023-08-26 ENCOUNTER — Ambulatory Visit (INDEPENDENT_AMBULATORY_CARE_PROVIDER_SITE_OTHER): Payer: BLUE CROSS/BLUE SHIELD | Admitting: Internal Medicine

## 2023-08-26 ENCOUNTER — Encounter: Payer: Self-pay | Admitting: Internal Medicine

## 2023-08-26 VITALS — BP 130/78 | Ht 68.0 in | Wt 231.8 lb

## 2023-08-26 DIAGNOSIS — E66812 Obesity, class 2: Secondary | ICD-10-CM | POA: Diagnosis not present

## 2023-08-26 DIAGNOSIS — E7800 Pure hypercholesterolemia, unspecified: Secondary | ICD-10-CM | POA: Insufficient documentation

## 2023-08-26 DIAGNOSIS — Z6835 Body mass index (BMI) 35.0-35.9, adult: Secondary | ICD-10-CM

## 2023-08-26 DIAGNOSIS — R739 Hyperglycemia, unspecified: Secondary | ICD-10-CM | POA: Diagnosis not present

## 2023-08-26 MED ORDER — VALACYCLOVIR HCL 1 G PO TABS: 1000.0000 mg | ORAL_TABLET | Freq: Two times a day (BID) | ORAL | 0 refills | Status: DC | PRN

## 2023-08-26 MED ORDER — HYDROCHLOROTHIAZIDE 12.5 MG PO TABS: 12.5000 mg | ORAL_TABLET | Freq: Every day | ORAL | 1 refills | Status: DC

## 2023-08-26 NOTE — Progress Notes (Signed)
 Subjective:    Patient ID: [name], female    DOB: May 09, 1991, 33 y.o.   MRN: [id number]  HPI  Patient presents to clinic today for her annual exam.    Flu: never Tetanus: > 10 years ago COVID: never Pap smear: 03/2020 Dentist: as needed  Diet: She does eat meat. She consumes fruits and veggies. She tries to avoid fried foods. She drinks mostly water, sweet tea. Exercise: Walking  Review of Systems     Past Medical History:  Diagnosis Date   Hx of abnormal cervical Pap smear    Pt with several normal paps since abnormal pap   Hypertension    Irregular periods    history   Vaginal Pap smear, abnormal     Current Outpatient Medications  Medication Sig Dispense Refill   hydrALAZINE (APRESOLINE) 25 MG tablet Take 1 tablet (25 mg total) by mouth every 6 (six) hours. 30 tablet 0   hydrochlorothiazide (HYDRODIURIL) 12.5 MG tablet Take 1 tablet (12.5 mg total) by mouth daily. 30 tablet 0   labetalol (NORMODYNE) 200 MG tablet Take 4 tablets (800 mg total) by mouth every 8 (eight) hours. 360 tablet 0   oxyCODONE (OXY IR/ROXICODONE) 5 MG immediate release tablet Take 1-2 tablets (5-10 mg total) by mouth every 4 (four) hours as needed for moderate pain (pain score 4-6). 30 tablet 0   Prenatal Vit-Fe Fumarate-FA (PRENATAL MULTIVITAMIN) TABS tablet Take 1 tablet by mouth daily at 12 noon.     No current facility-administered medications for this visit.    No Known Allergies  Family History  Problem Relation Age of Onset   Hypertension Mother    Stroke Mother        mini stroke?   Thyroid disease Mother        thyroid removed   Dementia Mother        Vasicular Dementia   Hypertension Sister    Hypertension Sister    Dementia Maternal Grandmother    Breast cancer Maternal Aunt     Social History   Socioeconomic History   Marital status: Single    Spouse name: Not on file   Number of children: Not on file   Years of education: Not on file   Highest education  level: Not on file  Occupational History   Not on file  Tobacco Use   Smoking status: Former    Current packs/day: 0.00    Average packs/day: 0.5 packs/day for 7.0 years (3.5 ttl pk-yrs)    Types: Cigarettes    Start date: 07/06/2007    Quit date: 07/05/2014    Years since quitting: 9.1   Smokeless tobacco: Never  Vaping Use   Vaping status: Former  Substance and Sexual Activity   Alcohol use: Not Currently    Comment: seldom   Drug use: Not Currently   Sexual activity: Yes    Partners: Male    Birth control/protection: None  Other Topics Concern   Not on file  Social History Narrative   Not on file   Social Drivers of Health   Financial Resource Strain: Not on file  Food Insecurity: No Food Insecurity (05/13/2023)   Hunger Vital Sign    Worried About Running Out of Food in the Last Year: Never true    Ran Out of Food in the Last Year: Never true  Transportation Needs: No Transportation Needs (05/13/2023)   PRAPARE - Transportation    Lack of Transportation (Medical): No    Lack  of Transportation (Non-Medical): No  Physical Activity: Inactive (07/05/2017)   Exercise Vital Sign    Days of Exercise per Week: 0 days    Minutes of Exercise per Session: 0 min  Stress: Not on file  Social Connections: Not on file  Intimate Partner Violence: Not At Risk (05/13/2023)   Humiliation, Afraid, Rape, and Kick questionnaire    Fear of Current or Ex-Partner: No    Emotionally Abused: No    Physically Abused: No    Sexually Abused: No     Constitutional: Denies fever, malaise, fatigue, headache or abrupt weight changes.  HEENT: Denies eye pain, eye redness, ear pain, ringing in the ears, wax buildup, runny nose, nasal congestion, bloody nose, or sore throat. Respiratory: Denies difficulty breathing, shortness of breath, cough or sputum production.   Cardiovascular: Denies chest pain, chest tightness, palpitations or swelling in the hands or feet.  Gastrointestinal: Pt reports  intermittent constipation. Denies abdominal pain, bloating,  diarrhea or blood in the stool.  GU: Denies urgency, frequency, pain with urination, burning sensation, blood in urine, odor or discharge. Musculoskeletal: Denies decrease in range of motion, difficulty with gait, muscle pain or joint pain and swelling.  Skin: Patient reports mass of abdomen.  Denies redness, rashes, or ulcercations.  Neurological: Denies dizziness, difficulty with memory, difficulty with speech or problems with balance and coordination.  Psych: Denies anxiety, depression, SI/HI.  No other specific complaints in a complete review of systems (except as listed in HPI above).  Objective:   Physical Exam  BP 130/78 (BP Location: Left Arm, Patient Position: Sitting, Cuff Size: Normal)   Ht 5\' 8"  (1.727 m)   Wt 231 lb 12.8 oz (105.1 kg)   LMP 08/15/2023 (Approximate)   Breastfeeding No   BMI 35.25 kg/m    Wt Readings from Last 3 Encounters:  05/14/23 269 lb (122 kg)  05/05/23 267 lb (121.1 kg)  08/24/22 235 lb (106.6 kg)    General: Appears her stated age, obese, in NAD. Skin: Warm, dry and intact. No rashes noted.  Excessive hair noted on chin.  Area of scar tissue versus fat necrosis noted superior to C-section scar. HEENT: Head: normal shape and size; Eyes: sclera white, no icterus, conjunctiva pink, PERRLA and EOMs intact;  Neck:  Neck supple, trachea midline. No masses, lumps present.  Thyromegaly noted. Cardiovascular: Normal rate and rhythm. S1,S2 noted.  No murmur, rubs or gallops noted. No JVD or BLE edema.  Pulmonary/Chest: Normal effort and positive vesicular breath sounds. No respiratory distress. No wheezes, rales or ronchi noted.  Abdomen: Soft and nontender. Normal bowel sounds. No distention or masses noted. Liver, spleen and kidneys non palpable. Musculoskeletal: Strength 5/5 BUE/BLE.  No difficulty with gait.  Neurological: Alert and oriented. Cranial nerves II-XII grossly intact. Coordination  normal.  Psychiatric: Mood and affect normal. Behavior is normal. Judgment and thought content normal.     BMET    Component Value Date/Time   NA 135 05/19/2023 0924   K 4.5 05/19/2023 0924   CL 106 05/19/2023 0924   CO2 24 05/19/2023 0924   GLUCOSE 78 05/19/2023 0924   BUN 10 05/19/2023 0924   CREATININE 0.73 05/19/2023 0924   CREATININE 0.75 08/24/2022 1542   CALCIUM 8.6 (L) 05/19/2023 0924   GFRNONAA >60 05/19/2023 0924   GFRNONAA 103 03/27/2020 1020   GFRAA 119 03/27/2020 1020    Lipid Panel     Component Value Date/Time   CHOL 190 08/24/2022 1542   CHOL 163 07/05/2017  1028   TRIG 132 08/24/2022 1542   HDL 47 (L) 08/24/2022 1542   HDL 56 07/05/2017 1028   CHOLHDL 4.0 08/24/2022 1542   LDLCALC 118 (H) 08/24/2022 1542    CBC    Component Value Date/Time   WBC 7.5 05/19/2023 0924   RBC 3.31 (L) 05/19/2023 0924   HGB 9.8 (L) 05/19/2023 0924   HCT 30.2 (L) 05/19/2023 0924   PLT 273 05/19/2023 0924   MCV 91.2 05/19/2023 0924   MCH 29.6 05/19/2023 0924   MCHC 32.5 05/19/2023 0924   RDW 14.3 05/19/2023 0924   LYMPHSABS 2.6 12/28/2020 0110   MONOABS 0.8 12/28/2020 0110   EOSABS 0.6 (H) 12/28/2020 0110   BASOSABS 0.1 12/28/2020 0110    Hgb A1C Lab Results  Component Value Date   HGBA1C 5.6 08/24/2022           Assessment & Plan:   Preventative Health Maintenance:  Encouraged her to get a flu shot in fall Tetanus declined Encouraged her to get her COVID vaccines Pap smear UTD Encouraged her to consume a balanced diet and exercise regimen Advised her to see a dentist annually We will check CBC, c-Met, lipid, A1c today  RTC in 6 months, follow-up chronic conditions Nicki Reaper, NP

## 2023-08-26 NOTE — Assessment & Plan Note (Signed)
 Encouraged diet and exercise for weight loss ?

## 2023-08-26 NOTE — Patient Instructions (Signed)

## 2023-08-27 ENCOUNTER — Encounter: Payer: Self-pay | Admitting: Internal Medicine

## 2023-08-27 ENCOUNTER — Telehealth: Payer: Self-pay

## 2023-08-27 LAB — COMPLETE METABOLIC PANEL WITH GFR
AG Ratio: 1.9 (calc) (ref 1.0–2.5)
ALT: 15 U/L (ref 6–29)
AST: 12 U/L (ref 10–30)
Albumin: 4 g/dL (ref 3.6–5.1)
Alkaline phosphatase (APISO): 38 U/L (ref 31–125)
BUN: 8 mg/dL (ref 7–25)
CO2: 26 mmol/L (ref 20–32)
Calcium: 9.3 mg/dL (ref 8.6–10.2)
Chloride: 109 mmol/L (ref 98–110)
Creat: 0.77 mg/dL (ref 0.50–0.97)
Globulin: 2.1 g/dL (ref 1.9–3.7)
Glucose, Bld: 80 mg/dL (ref 65–99)
Potassium: 3.8 mmol/L (ref 3.5–5.3)
Sodium: 141 mmol/L (ref 135–146)
Total Bilirubin: 0.6 mg/dL (ref 0.2–1.2)
Total Protein: 6.1 g/dL (ref 6.1–8.1)

## 2023-08-27 LAB — CBC
HCT: 36.5 % (ref 35.0–45.0)
Hemoglobin: 12.4 g/dL (ref 11.7–15.5)
MCH: 30.8 pg (ref 27.0–33.0)
MCHC: 34 g/dL (ref 32.0–36.0)
MCV: 90.6 fL (ref 80.0–100.0)
MPV: 10.4 fL (ref 7.5–12.5)
Platelets: 351 10*3/uL (ref 140–400)
RBC: 4.03 10*6/uL (ref 3.80–5.10)
RDW: 13.9 % (ref 11.0–15.0)
WBC: 7.2 10*3/uL (ref 3.8–10.8)

## 2023-08-27 LAB — LIPID PANEL
Cholesterol: 164 mg/dL (ref <200)
HDL: 43 mg/dL — ABNORMAL LOW (ref >=50)
LDL Cholesterol (Calc): 100 mg/dL — ABNORMAL HIGH
Non-HDL Cholesterol (Calc): 121 mg/dL (ref <130)
Total CHOL/HDL Ratio: 3.8 (calc) (ref <5.0)
Triglycerides: 114 mg/dL (ref <150)

## 2023-08-27 LAB — HEMOGLOBIN A1C
Hgb A1c MFr Bld: 5.3 %{Hb} (ref <5.7)
Mean Plasma Glucose: 105 mg/dL
eAG (mmol/L): 5.8 mmol/L

## 2023-08-27 NOTE — Telephone Encounter (Signed)
 Copied from CRM [id number]. Topic: Clinical - Medication Question >> Aug 26, 2023  4:44 PM [name] wrote: Reason for CRM: pt called in wants to know is she still supposed to take the med, labetalol (NORMODYNE) 200 MG tablet along with her bp med, hydrochlorathyzide

## 2023-08-27 NOTE — Telephone Encounter (Signed)
 If she is currently taking it, I would continue it.  Her blood pressure was much improved at this last visit.

## 2023-12-03 ENCOUNTER — Other Ambulatory Visit: Payer: Self-pay

## 2023-12-03 ENCOUNTER — Encounter: Payer: Self-pay | Admitting: Internal Medicine

## 2023-12-03 MED ORDER — VALACYCLOVIR HCL 1 G PO TABS: 1000.0000 mg | ORAL_TABLET | Freq: Two times a day (BID) | ORAL | 0 refills | Status: DC | PRN

## 2024-01-12 MED ORDER — VALACYCLOVIR HCL 1 G PO TABS: 1000.0000 mg | ORAL_TABLET | Freq: Two times a day (BID) | ORAL | 0 refills | Status: DC | PRN

## 2024-01-12 NOTE — Addendum Note (Signed)
 Addended by: ANTONETTE ANGELINE ORN on: 01/12/2024 08:09 AM   Modules accepted: Orders

## 2024-02-28 ENCOUNTER — Other Ambulatory Visit: Payer: Self-pay | Admitting: Internal Medicine

## 2024-02-29 ENCOUNTER — Encounter: Payer: Self-pay | Admitting: Internal Medicine

## 2024-02-29 ENCOUNTER — Ambulatory Visit: Admitting: Internal Medicine

## 2024-02-29 NOTE — Progress Notes (Deleted)
 Subjective:    Patient ID: [name], female    DOB: Oct 03, 1990, 33 y.o.   MRN: [id number]  HPI  Patient presents to clinic today for follow-up chronic conditions.  HTN: Her BP today is 136/84.  She is taking hydrochlorothiazide  and labetalol  as prescribed.  ECG from 11/2018 reviewed.  PCOS: She reports she has regular periods.  She is not currently taking metformin or contraceptives.  She has been trying to conceive for 2 years without success.  She would like referral to an infertility center.  HLD: Her last LDL was 100, triglycerides 114, 08/2023. She is not taking any cholesterol lowering medication at this time. She tries to consume a low fat diet.   Review of Systems     Past Medical History:  Diagnosis Date   Hx of abnormal cervical Pap smear    Pt with several normal paps since abnormal pap   Hypertension    Irregular periods    history   Vaginal Pap smear, abnormal     Current Outpatient Medications  Medication Sig Dispense Refill   hydrochlorothiazide  (HYDRODIURIL ) 12.5 MG tablet Take 1 tablet (12.5 mg total) by mouth daily. 90 tablet 1   labetalol  (NORMODYNE ) 200 MG tablet Take 4 tablets (800 mg total) by mouth every 8 (eight) hours. 360 tablet 0   Prenatal Vit-Fe Fumarate-FA (PRENATAL MULTIVITAMIN) TABS tablet Take 1 tablet by mouth daily at 12 noon.     valACYclovir  (VALTREX ) 1000 MG tablet Take 1 tablet (1,000 mg total) by mouth 2 (two) times daily as needed. 30 tablet 0   No current facility-administered medications for this visit.    No Known Allergies  Family History  Problem Relation Age of Onset   Hypertension Mother    Stroke Mother        mini stroke?   Thyroid  disease Mother        thyroid  removed   Dementia Mother        Vasicular Dementia   Hypertension Sister    Hypertension Sister    Dementia Maternal Grandmother    Breast cancer Maternal Aunt     Social History   Socioeconomic History   Marital status: Single    Spouse name:  Not on file   Number of children: Not on file   Years of education: Not on file   Highest education level: Not on file  Occupational History   Not on file  Tobacco Use   Smoking status: Former    Current packs/day: 0.00    Average packs/day: 0.5 packs/day for 7.0 years (3.5 ttl pk-yrs)    Types: Cigarettes    Start date: 07/06/2007    Quit date: 07/05/2014    Years since quitting: 9.6   Smokeless tobacco: Never  Vaping Use   Vaping status: Former  Substance and Sexual Activity   Alcohol use: Not Currently    Comment: seldom   Drug use: Yes    Types: Marijuana   Sexual activity: Yes    Partners: Male    Birth control/protection: None  Other Topics Concern   Not on file  Social History Narrative   Not on file   Social Drivers of Health   Financial Resource Strain: Not on file  Food Insecurity: No Food Insecurity (05/13/2023)   Hunger Vital Sign    Worried About Running Out of Food in the Last Year: Never true    Ran Out of Food in the Last Year: Never true  Transportation Needs: No Transportation  Needs (05/13/2023)   PRAPARE - Administrator, Civil Service (Medical): No    Lack of Transportation (Non-Medical): No  Physical Activity: Inactive (07/05/2017)   Exercise Vital Sign    Days of Exercise per Week: 0 days    Minutes of Exercise per Session: 0 min  Stress: Not on file  Social Connections: Not on file  Intimate Partner Violence: Not At Risk (05/13/2023)   Humiliation, Afraid, Rape, and Kick questionnaire    Fear of Current or Ex-Partner: No    Emotionally Abused: No    Physically Abused: No    Sexually Abused: No     Constitutional: Denies fever, malaise, fatigue, headache or abrupt weight changes.  HEENT: Denies eye pain, eye redness, ear pain, ringing in the ears, wax buildup, runny nose, nasal congestion, bloody nose, or sore throat. Respiratory: Denies difficulty breathing, shortness of breath, cough or sputum production.   Cardiovascular: Denies  chest pain, chest tightness, palpitations or swelling in the hands or feet.  Gastrointestinal: Pt reports intermittent constipation. Denies abdominal pain, bloating,  diarrhea or blood in the stool.  GU: Patient reports difficulty conceiving.  Denies urgency, frequency, pain with urination, burning sensation, blood in urine, odor or discharge. Musculoskeletal: Denies decrease in range of motion, difficulty with gait, muscle pain or joint pain and swelling.  Skin: Denies redness, rashes, lesions or ulcercations.  Neurological: Denies dizziness, difficulty with memory, difficulty with speech or problems with balance and coordination.  Psych: Denies anxiety, depression, SI/HI.  No other specific complaints in a complete review of systems (except as listed in HPI above).  Objective:   Physical Exam  There were no vitals taken for this visit.  Wt Readings from Last 3 Encounters:  08/26/23 231 lb 12.8 oz (105.1 kg)  05/14/23 269 lb (122 kg)  05/05/23 267 lb (121.1 kg)    General: Appears her stated age, obese, in NAD. Skin: Warm, dry and intact. No rashes noted.  Excessive hair noted on chin. HEENT: Head: normal shape and size; Eyes: sclera white, no icterus, conjunctiva pink, PERRLA and EOMs intact;  Neck:  Neck supple, trachea midline. No masses, lumps present.  Thyromegaly noted. Cardiovascular: Normal rate and rhythm. S1,S2 noted.  No murmur, rubs or gallops noted. No JVD or BLE edema.  Pulmonary/Chest: Normal effort and positive vesicular breath sounds. No respiratory distress. No wheezes, rales or ronchi noted.  Abdomen: Soft and nontender. Normal bowel sounds. No distention or masses noted. Liver, spleen and kidneys non palpable. Musculoskeletal: Strength 5/5 BUE/BLE.  No difficulty with gait.  Neurological: Alert and oriented. Cranial nerves II-XII grossly intact. Coordination normal.  Psychiatric: Mood and affect normal. Behavior is normal. Judgment and thought content normal.      BMET    Component Value Date/Time   NA 141 08/26/2023 1522   K 3.8 08/26/2023 1522   CL 109 08/26/2023 1522   CO2 26 08/26/2023 1522   GLUCOSE 80 08/26/2023 1522   BUN 8 08/26/2023 1522   CREATININE 0.77 08/26/2023 1522   CALCIUM 9.3 08/26/2023 1522   GFRNONAA >60 05/19/2023 0924   GFRNONAA 103 03/27/2020 1020   GFRAA 119 03/27/2020 1020    Lipid Panel     Component Value Date/Time   CHOL 164 08/26/2023 1522   CHOL 163 07/05/2017 1028   TRIG 114 08/26/2023 1522   HDL 43 (L) 08/26/2023 1522   HDL 56 07/05/2017 1028   CHOLHDL 3.8 08/26/2023 1522   LDLCALC 100 (H) 08/26/2023 1522  CBC    Component Value Date/Time   WBC 7.2 08/26/2023 1522   RBC 4.03 08/26/2023 1522   HGB 12.4 08/26/2023 1522   HCT 36.5 08/26/2023 1522   PLT 351 08/26/2023 1522   MCV 90.6 08/26/2023 1522   MCH 30.8 08/26/2023 1522   MCHC 34.0 08/26/2023 1522   RDW 13.9 08/26/2023 1522   LYMPHSABS 2.6 12/28/2020 0110   MONOABS 0.8 12/28/2020 0110   EOSABS 0.6 (H) 12/28/2020 0110   BASOSABS 0.1 12/28/2020 0110    Hgb A1C Lab Results  Component Value Date   HGBA1C 5.3 08/26/2023           Assessment & Plan:     RTC in 6 months for your annual game Reynolds Laura, NP

## 2024-02-29 NOTE — Telephone Encounter (Signed)
 Requested Prescriptions  Pending Prescriptions Disp Refills   hydrochlorothiazide  (HYDRODIURIL ) 12.5 MG tablet [Pharmacy Med Name: HYDROCHLOROTHIAZIDE  12.5MG  TABLETS] 90 tablet 0    Sig: TAKE 1 TABLET(12.5 MG) BY MOUTH DAILY     Cardiovascular: Diuretics - Thiazide Failed - 02/29/2024  9:51 AM      Failed - Cr in normal range and within 180 days    Creat  Date Value Ref Range Status  08/26/2023 0.77 0.50 - 0.97 mg/dL Final   Creatinine, Urine  Date Value Ref Range Status  05/13/2023 66 mg/dL Final         Failed - K in normal range and within 180 days    Potassium  Date Value Ref Range Status  08/26/2023 3.8 3.5 - 5.3 mmol/L Final         Failed - Na in normal range and within 180 days    Sodium  Date Value Ref Range Status  08/26/2023 141 135 - 146 mmol/L Final         Failed - Valid encounter within last 6 months    Recent Outpatient Visits           6 months ago Pure hypercholesterolemia   [facility] [place], [place] W, NP              Passed - Last BP in normal range    BP Readings from Last 1 Encounters:  08/26/23 130/78

## 2024-03-09 ENCOUNTER — Ambulatory Visit (INDEPENDENT_AMBULATORY_CARE_PROVIDER_SITE_OTHER): Admitting: Internal Medicine

## 2024-03-09 ENCOUNTER — Encounter: Payer: Self-pay | Admitting: Internal Medicine

## 2024-03-09 VITALS — BP 128/74 | Ht 68.0 in | Wt 227.0 lb

## 2024-03-09 DIAGNOSIS — R0981 Nasal congestion: Secondary | ICD-10-CM | POA: Diagnosis not present

## 2024-03-09 DIAGNOSIS — I10 Essential (primary) hypertension: Secondary | ICD-10-CM

## 2024-03-09 DIAGNOSIS — Z6834 Body mass index (BMI) 34.0-34.9, adult: Secondary | ICD-10-CM

## 2024-03-09 DIAGNOSIS — E282 Polycystic ovarian syndrome: Secondary | ICD-10-CM

## 2024-03-09 DIAGNOSIS — E7800 Pure hypercholesterolemia, unspecified: Secondary | ICD-10-CM

## 2024-03-09 DIAGNOSIS — E66811 Obesity, class 1: Secondary | ICD-10-CM

## 2024-03-09 DIAGNOSIS — E6609 Other obesity due to excess calories: Secondary | ICD-10-CM

## 2024-03-09 LAB — POC COVID19/FLU A&B COMBO
Covid Antigen, POC: NEGATIVE
Influenza A Antigen, POC: NEGATIVE
Influenza B Antigen, POC: NEGATIVE

## 2024-03-09 MED ORDER — HYDROCHLOROTHIAZIDE 12.5 MG PO TABS: 12.5000 mg | ORAL_TABLET | Freq: Every day | ORAL | 0 refills | Status: AC

## 2024-03-09 NOTE — Assessment & Plan Note (Addendum)
 Complicated by morbid obesity Controlled on hydrochlorothiazide  12.5 mg dailiy Reinforced DASH diet and exercise weight loss Will check kidney function at annual exam

## 2024-03-09 NOTE — Assessment & Plan Note (Signed)
 Complicated by morbid obesity Will check lipid panel at annual exam Encouraged low fat diet

## 2024-03-09 NOTE — Assessment & Plan Note (Signed)
 Encouraged diet and exercise for weight loss ?

## 2024-03-09 NOTE — Patient Instructions (Signed)

## 2024-03-09 NOTE — Progress Notes (Addendum)
 Subjective:    Patient ID: [name], female    DOB: 23-Oct-1990, 33 y.o.   MRN: [id number]  HPI  Patient presents to clinic today for follow-up chronic conditions.  HTN: Her BP today is 128/74.  She is taking HCTZ as prescribed.  ECG from 11/2018 reviewed.  PCOS: She reports she has regular periods.  She is not currently taking metformin or contraceptives.  She follows with GYN.  HLD: Her last LDL was 100, triglycerides 114, 08/2023. She is not taking any cholesterol lowering medication at this time. She does not consume a low fat diet.  She also reports nasal congestion. This started about 5 days ago. She is not blowing much mucous out of her nose. She denies headache, runny nose, ear pain, sore throat, cough or SOB. She denies fever, chills or body aches.  Review of Systems     Past Medical History:  Diagnosis Date   Hx of abnormal cervical Pap smear    Pt with several normal paps since abnormal pap   Hypertension    Irregular periods    history   Vaginal Pap smear, abnormal     Current Outpatient Medications  Medication Sig Dispense Refill   hydrochlorothiazide  (HYDRODIURIL ) 12.5 MG tablet TAKE 1 TABLET(12.5 MG) BY MOUTH DAILY 90 tablet 0   labetalol  (NORMODYNE ) 200 MG tablet Take 4 tablets (800 mg total) by mouth every 8 (eight) hours. 360 tablet 0   Prenatal Vit-Fe Fumarate-FA (PRENATAL MULTIVITAMIN) TABS tablet Take 1 tablet by mouth daily at 12 noon.     valACYclovir  (VALTREX ) 1000 MG tablet Take 1 tablet (1,000 mg total) by mouth 2 (two) times daily as needed. 30 tablet 0   No current facility-administered medications for this visit.    No Known Allergies  Family History  Problem Relation Age of Onset   Hypertension Mother    Stroke Mother        mini stroke?   Thyroid  disease Mother        thyroid  removed   Dementia Mother        Vasicular Dementia   Hypertension Sister    Hypertension Sister    Dementia Maternal Grandmother    Breast cancer  Maternal Aunt     Social History   Socioeconomic History   Marital status: Single    Spouse name: Not on file   Number of children: Not on file   Years of education: Not on file   Highest education level: Not on file  Occupational History   Not on file  Tobacco Use   Smoking status: Former    Current packs/day: 0.00    Average packs/day: 0.5 packs/day for 7.0 years (3.5 ttl pk-yrs)    Types: Cigarettes    Start date: 07/06/2007    Quit date: 07/05/2014    Years since quitting: 9.6   Smokeless tobacco: Never  Vaping Use   Vaping status: Former  Substance and Sexual Activity   Alcohol use: Not Currently    Comment: seldom   Drug use: Yes    Types: Marijuana   Sexual activity: Yes    Partners: Male    Birth control/protection: None  Other Topics Concern   Not on file  Social History Narrative   Not on file   Social Drivers of Health   Financial Resource Strain: Not on file  Food Insecurity: No Food Insecurity (05/13/2023)   Hunger Vital Sign    Worried About Running Out of Food in the Last Year:  Never true    Ran Out of Food in the Last Year: Never true  Transportation Needs: No Transportation Needs (05/13/2023)   PRAPARE - Administrator, Civil Service (Medical): No    Lack of Transportation (Non-Medical): No  Physical Activity: Inactive (07/05/2017)   Exercise Vital Sign    Days of Exercise per Week: 0 days    Minutes of Exercise per Session: 0 min  Stress: Not on file  Social Connections: Not on file  Intimate Partner Violence: Not At Risk (05/13/2023)   Humiliation, Afraid, Rape, and Kick questionnaire    Fear of Current or Ex-Partner: No    Emotionally Abused: No    Physically Abused: No    Sexually Abused: No     Constitutional: Denies fever, malaise, fatigue, headache or abrupt weight changes.  HEENT: Pt reports nasal congestion. Denies eye pain, eye redness, ear pain, ringing in the ears, wax buildup, runny nose, bloody nose, or sore  throat. Respiratory: Denies difficulty breathing, shortness of breath, cough or sputum production.   Cardiovascular: Denies chest pain, chest tightness, palpitations or swelling in the hands or feet.  Gastrointestinal: Pt reports intermittent constipation. Denies abdominal pain, bloating,  diarrhea or blood in the stool.  GU: Denies urgency, frequency, pain with urination, burning sensation, blood in urine, odor or discharge. Musculoskeletal: Denies decrease in range of motion, difficulty with gait, muscle pain or joint pain and swelling.  Skin: Denies redness, rashes, lesions or ulcercations.  Neurological: Denies dizziness, difficulty with memory, difficulty with speech or problems with balance and coordination.  Psych: Denies anxiety, depression, SI/HI.  No other specific complaints in a complete review of systems (except as listed in HPI above).  Objective:   Physical Exam BP 128/74 (BP Location: Left Arm, Patient Position: Sitting, Cuff Size: Normal)   Ht 5' 8 (1.727 m)   Wt 227 lb (103 kg)   LMP 02/18/2024 (Exact Date)   BMI 34.52 kg/m    Wt Readings from Last 3 Encounters:  08/26/23 231 lb 12.8 oz (105.1 kg)  05/14/23 269 lb (122 kg)  05/05/23 267 lb (121.1 kg)    General: Appears her stated age, obese, in NAD. Skin: Warm, dry and intact. No rashes noted.  Excessive hair noted on chin. HEENT: Head: normal shape and size; Eyes: sclera white, no icterus, conjunctiva pink, PERRLA and EOMs intact;  Cardiovascular: Normal rate and rhythm. S1,S2 noted.  No murmur, rubs or gallops noted. No JVD or BLE edema.  Pulmonary/Chest: Normal effort and positive vesicular breath sounds. No respiratory distress. No wheezes, rales or ronchi noted.  Musculoskeletal:  No difficulty with gait.  Neurological: Alert and oriented. Coordination normal.    BMET    Component Value Date/Time   NA 141 08/26/2023 1522   K 3.8 08/26/2023 1522   CL 109 08/26/2023 1522   CO2 26 08/26/2023 1522    GLUCOSE 80 08/26/2023 1522   BUN 8 08/26/2023 1522   CREATININE 0.77 08/26/2023 1522   CALCIUM 9.3 08/26/2023 1522   GFRNONAA >60 05/19/2023 0924   GFRNONAA 103 03/27/2020 1020   GFRAA 119 03/27/2020 1020    Lipid Panel     Component Value Date/Time   CHOL 164 08/26/2023 1522   CHOL 163 07/05/2017 1028   TRIG 114 08/26/2023 1522   HDL 43 (L) 08/26/2023 1522   HDL 56 07/05/2017 1028   CHOLHDL 3.8 08/26/2023 1522   LDLCALC 100 (H) 08/26/2023 1522    CBC    Component Value  Date/Time   WBC 7.2 08/26/2023 1522   RBC 4.03 08/26/2023 1522   HGB 12.4 08/26/2023 1522   HCT 36.5 08/26/2023 1522   PLT 351 08/26/2023 1522   MCV 90.6 08/26/2023 1522   MCH 30.8 08/26/2023 1522   MCHC 34.0 08/26/2023 1522   RDW 13.9 08/26/2023 1522   LYMPHSABS 2.6 12/28/2020 0110   MONOABS 0.8 12/28/2020 0110   EOSABS 0.6 (H) 12/28/2020 0110   BASOSABS 0.1 12/28/2020 0110    Hgb A1C Lab Results  Component Value Date   HGBA1C 5.3 08/26/2023           Assessment & Plan:   Nasal congestion:  Rapid covid and flu negative Recommend flonase 1 spray each nostril daily  RTC in 6 months, for your annual exam Angeline Laura, NP

## 2024-03-09 NOTE — Assessment & Plan Note (Signed)
She will continue to follow with GYN 

## 2024-08-24 ENCOUNTER — Encounter: Admitting: Internal Medicine
